# Patient Record
Sex: Female | Born: 1940
Health system: Southern US, Community
[De-identification: ages and names within clinical notes are randomized; demographics above are authoritative.]

## PROBLEM LIST (undated history)

## (undated) DIAGNOSIS — E782 Mixed hyperlipidemia: Secondary | ICD-10-CM

## (undated) DIAGNOSIS — I10 Essential (primary) hypertension: Secondary | ICD-10-CM

## (undated) DIAGNOSIS — J309 Allergic rhinitis, unspecified: Secondary | ICD-10-CM

## (undated) DIAGNOSIS — I471 Supraventricular tachycardia, unspecified: Secondary | ICD-10-CM

## (undated) DIAGNOSIS — G4700 Insomnia, unspecified: Secondary | ICD-10-CM

## (undated) DIAGNOSIS — Z86010 Personal history of colonic polyps: Secondary | ICD-10-CM

## (undated) DIAGNOSIS — M797 Fibromyalgia: Secondary | ICD-10-CM

## (undated) DIAGNOSIS — K589 Irritable bowel syndrome without diarrhea: Secondary | ICD-10-CM

## (undated) DIAGNOSIS — Z8542 Personal history of malignant neoplasm of other parts of uterus: Secondary | ICD-10-CM

## (undated) DIAGNOSIS — F411 Generalized anxiety disorder: Secondary | ICD-10-CM

## (undated) DIAGNOSIS — E7800 Pure hypercholesterolemia, unspecified: Secondary | ICD-10-CM

## (undated) DIAGNOSIS — R002 Palpitations: Secondary | ICD-10-CM

## (undated) DIAGNOSIS — F419 Anxiety disorder, unspecified: Secondary | ICD-10-CM

## (undated) DIAGNOSIS — N3010 Interstitial cystitis (chronic) without hematuria: Secondary | ICD-10-CM

## (undated) HISTORY — DX: Supraventricular tachycardia: I47.1

## (undated) HISTORY — DX: Anxiety disorder, unspecified: F41.9

## (undated) HISTORY — PX: BACK SURGERY: SHX140

## (undated) HISTORY — PX: BLADDER SURGERY: SHX569

## (undated) HISTORY — PX: TONSILLECTOMY: SUR1361

## (undated) HISTORY — PX: CATARACT EXTRACTION: SUR2

## (undated) HISTORY — DX: Insomnia, unspecified: G47.00

## (undated) HISTORY — DX: Mixed hyperlipidemia: E78.2

## (undated) HISTORY — DX: Essential (primary) hypertension: I10

## (undated) HISTORY — DX: Generalized anxiety disorder: F41.1

## (undated) HISTORY — DX: Palpitations: R00.2

## (undated) HISTORY — DX: Irritable bowel syndrome without diarrhea: K58.9

## (undated) HISTORY — DX: Personal history of malignant neoplasm of other parts of uterus: Z85.42

## (undated) HISTORY — PX: ABDOMINAL HYSTERECTOMY: SUR658

## (undated) HISTORY — DX: Fibromyalgia: M79.7

## (undated) HISTORY — DX: Interstitial cystitis (chronic) without hematuria: N30.10

## (undated) HISTORY — DX: Personal history of colonic polyps: Z86.010

## (undated) HISTORY — DX: Allergic rhinitis, unspecified: J30.9

## (undated) HISTORY — DX: Pure hypercholesterolemia, unspecified: E78.00

## (undated) HISTORY — DX: Supraventricular tachycardia, unspecified: I47.10

---

## 1998-11-19 ENCOUNTER — Emergency Department (HOSPITAL_COMMUNITY): Admission: EM | Admit: 1998-11-19 | Discharge: 1998-11-19 | Payer: Self-pay | Admitting: Emergency Medicine

## 1998-11-19 ENCOUNTER — Encounter: Payer: Self-pay | Admitting: Emergency Medicine

## 1998-11-23 ENCOUNTER — Other Ambulatory Visit: Admission: RE | Admit: 1998-11-23 | Discharge: 1998-11-23 | Payer: Self-pay | Admitting: *Deleted

## 1998-12-21 ENCOUNTER — Emergency Department (HOSPITAL_COMMUNITY): Admission: EM | Admit: 1998-12-21 | Discharge: 1998-12-21 | Payer: Self-pay | Admitting: Emergency Medicine

## 1998-12-25 ENCOUNTER — Ambulatory Visit (HOSPITAL_COMMUNITY): Admission: RE | Admit: 1998-12-25 | Discharge: 1998-12-25 | Payer: Self-pay | Admitting: Urology

## 1999-06-03 ENCOUNTER — Encounter: Payer: Self-pay | Admitting: Emergency Medicine

## 1999-06-03 ENCOUNTER — Emergency Department (HOSPITAL_COMMUNITY): Admission: EM | Admit: 1999-06-03 | Discharge: 1999-06-03 | Payer: Self-pay | Admitting: Emergency Medicine

## 1999-08-26 ENCOUNTER — Encounter: Admission: RE | Admit: 1999-08-26 | Discharge: 1999-08-26 | Payer: Self-pay | Admitting: Neurological Surgery

## 1999-08-28 ENCOUNTER — Encounter: Payer: Self-pay | Admitting: Neurological Surgery

## 1999-08-28 ENCOUNTER — Encounter: Admission: RE | Admit: 1999-08-28 | Discharge: 1999-08-28 | Payer: Self-pay | Admitting: Neurological Surgery

## 1999-11-25 ENCOUNTER — Other Ambulatory Visit: Admission: RE | Admit: 1999-11-25 | Discharge: 1999-11-25 | Payer: Self-pay | Admitting: *Deleted

## 2000-02-18 ENCOUNTER — Emergency Department (HOSPITAL_COMMUNITY): Admission: EM | Admit: 2000-02-18 | Discharge: 2000-02-18 | Payer: Self-pay | Admitting: Emergency Medicine

## 2000-05-25 ENCOUNTER — Other Ambulatory Visit: Admission: RE | Admit: 2000-05-25 | Discharge: 2000-05-25 | Payer: Self-pay | Admitting: *Deleted

## 2000-05-25 ENCOUNTER — Encounter (INDEPENDENT_AMBULATORY_CARE_PROVIDER_SITE_OTHER): Payer: Self-pay

## 2000-06-16 ENCOUNTER — Ambulatory Visit (HOSPITAL_COMMUNITY): Admission: RE | Admit: 2000-06-16 | Discharge: 2000-06-16 | Payer: Self-pay | Admitting: *Deleted

## 2000-06-16 ENCOUNTER — Encounter: Payer: Self-pay | Admitting: *Deleted

## 2000-06-17 ENCOUNTER — Inpatient Hospital Stay (HOSPITAL_COMMUNITY): Admission: RE | Admit: 2000-06-17 | Discharge: 2000-06-19 | Payer: Self-pay | Admitting: *Deleted

## 2000-06-17 ENCOUNTER — Encounter (INDEPENDENT_AMBULATORY_CARE_PROVIDER_SITE_OTHER): Payer: Self-pay

## 2000-06-17 ENCOUNTER — Encounter (INDEPENDENT_AMBULATORY_CARE_PROVIDER_SITE_OTHER): Payer: Self-pay | Admitting: Specialist

## 2000-10-05 ENCOUNTER — Encounter: Payer: Self-pay | Admitting: Neurological Surgery

## 2000-10-05 ENCOUNTER — Encounter: Admission: RE | Admit: 2000-10-05 | Discharge: 2000-10-05 | Payer: Self-pay | Admitting: Neurological Surgery

## 2000-10-08 ENCOUNTER — Encounter: Payer: Self-pay | Admitting: Neurological Surgery

## 2000-10-08 ENCOUNTER — Encounter: Admission: RE | Admit: 2000-10-08 | Discharge: 2000-10-08 | Payer: Self-pay | Admitting: Neurological Surgery

## 2000-11-20 ENCOUNTER — Encounter: Payer: Self-pay | Admitting: Family Medicine

## 2000-11-20 ENCOUNTER — Encounter: Admission: RE | Admit: 2000-11-20 | Discharge: 2000-11-20 | Payer: Self-pay | Admitting: Family Medicine

## 2001-01-11 ENCOUNTER — Encounter (INDEPENDENT_AMBULATORY_CARE_PROVIDER_SITE_OTHER): Payer: Self-pay | Admitting: *Deleted

## 2001-01-11 ENCOUNTER — Encounter: Payer: Self-pay | Admitting: Internal Medicine

## 2001-01-11 ENCOUNTER — Ambulatory Visit (HOSPITAL_COMMUNITY): Admission: RE | Admit: 2001-01-11 | Discharge: 2001-01-11 | Payer: Self-pay | Admitting: Gastroenterology

## 2001-04-28 ENCOUNTER — Emergency Department (HOSPITAL_COMMUNITY): Admission: EM | Admit: 2001-04-28 | Discharge: 2001-04-28 | Payer: Self-pay | Admitting: Emergency Medicine

## 2001-04-28 ENCOUNTER — Encounter: Payer: Self-pay | Admitting: Family Medicine

## 2001-11-03 ENCOUNTER — Other Ambulatory Visit: Admission: RE | Admit: 2001-11-03 | Discharge: 2001-11-03 | Payer: Self-pay | Admitting: Obstetrics and Gynecology

## 2001-12-02 ENCOUNTER — Emergency Department (HOSPITAL_COMMUNITY): Admission: EM | Admit: 2001-12-02 | Discharge: 2001-12-02 | Payer: Self-pay | Admitting: Emergency Medicine

## 2002-11-22 ENCOUNTER — Encounter: Admission: RE | Admit: 2002-11-22 | Discharge: 2003-02-20 | Payer: Self-pay | Admitting: Rheumatology

## 2003-04-07 ENCOUNTER — Ambulatory Visit (HOSPITAL_COMMUNITY): Admission: RE | Admit: 2003-04-07 | Discharge: 2003-04-07 | Payer: Self-pay | Admitting: Urology

## 2003-04-07 ENCOUNTER — Ambulatory Visit (HOSPITAL_BASED_OUTPATIENT_CLINIC_OR_DEPARTMENT_OTHER): Admission: RE | Admit: 2003-04-07 | Discharge: 2003-04-07 | Payer: Self-pay | Admitting: Urology

## 2003-09-12 ENCOUNTER — Ambulatory Visit (HOSPITAL_BASED_OUTPATIENT_CLINIC_OR_DEPARTMENT_OTHER): Admission: RE | Admit: 2003-09-12 | Discharge: 2003-09-12 | Payer: Self-pay | Admitting: Urology

## 2003-11-09 ENCOUNTER — Inpatient Hospital Stay (HOSPITAL_COMMUNITY): Admission: RE | Admit: 2003-11-09 | Discharge: 2003-11-17 | Payer: Self-pay | Admitting: Urology

## 2003-11-10 ENCOUNTER — Encounter (INDEPENDENT_AMBULATORY_CARE_PROVIDER_SITE_OTHER): Payer: Self-pay | Admitting: *Deleted

## 2003-12-18 ENCOUNTER — Ambulatory Visit (HOSPITAL_COMMUNITY): Admission: RE | Admit: 2003-12-18 | Discharge: 2003-12-18 | Payer: Self-pay | Admitting: Urology

## 2004-01-25 ENCOUNTER — Ambulatory Visit: Payer: Self-pay | Admitting: Internal Medicine

## 2005-01-27 HISTORY — PX: ESOPHAGOGASTRODUODENOSCOPY: SHX1529

## 2005-05-20 ENCOUNTER — Ambulatory Visit: Payer: Self-pay | Admitting: Internal Medicine

## 2005-05-21 ENCOUNTER — Ambulatory Visit: Payer: Self-pay | Admitting: Internal Medicine

## 2005-05-21 ENCOUNTER — Encounter (INDEPENDENT_AMBULATORY_CARE_PROVIDER_SITE_OTHER): Payer: Self-pay | Admitting: *Deleted

## 2005-07-01 ENCOUNTER — Ambulatory Visit: Payer: Self-pay | Admitting: Internal Medicine

## 2005-07-02 ENCOUNTER — Ambulatory Visit: Payer: Self-pay | Admitting: Internal Medicine

## 2005-10-17 ENCOUNTER — Inpatient Hospital Stay (HOSPITAL_COMMUNITY): Admission: EM | Admit: 2005-10-17 | Discharge: 2005-10-19 | Payer: Self-pay | Admitting: Emergency Medicine

## 2005-10-22 ENCOUNTER — Ambulatory Visit: Payer: Self-pay | Admitting: Gastroenterology

## 2005-11-05 ENCOUNTER — Ambulatory Visit: Payer: Self-pay | Admitting: Internal Medicine

## 2005-12-10 ENCOUNTER — Ambulatory Visit (HOSPITAL_COMMUNITY): Admission: RE | Admit: 2005-12-10 | Discharge: 2005-12-10 | Payer: Self-pay | Admitting: Urology

## 2007-08-09 ENCOUNTER — Telehealth: Payer: Self-pay | Admitting: Internal Medicine

## 2007-09-17 DIAGNOSIS — Z860100 Personal history of colon polyps, unspecified: Secondary | ICD-10-CM | POA: Insufficient documentation

## 2007-09-17 DIAGNOSIS — K589 Irritable bowel syndrome without diarrhea: Secondary | ICD-10-CM

## 2007-09-17 DIAGNOSIS — IMO0001 Reserved for inherently not codable concepts without codable children: Secondary | ICD-10-CM

## 2007-09-17 DIAGNOSIS — M797 Fibromyalgia: Secondary | ICD-10-CM | POA: Insufficient documentation

## 2007-09-17 DIAGNOSIS — N3010 Interstitial cystitis (chronic) without hematuria: Secondary | ICD-10-CM | POA: Insufficient documentation

## 2007-09-17 DIAGNOSIS — Z8542 Personal history of malignant neoplasm of other parts of uterus: Secondary | ICD-10-CM | POA: Insufficient documentation

## 2007-09-17 DIAGNOSIS — F411 Generalized anxiety disorder: Secondary | ICD-10-CM

## 2007-09-17 HISTORY — DX: Interstitial cystitis (chronic) without hematuria: N30.10

## 2007-09-17 HISTORY — DX: Irritable bowel syndrome, unspecified: K58.9

## 2007-09-17 HISTORY — DX: Personal history of colonic polyps: Z86.010

## 2007-09-17 HISTORY — DX: Personal history of malignant neoplasm of other parts of uterus: Z85.42

## 2007-09-17 HISTORY — DX: Personal history of colon polyps, unspecified: Z86.0100

## 2007-09-20 ENCOUNTER — Ambulatory Visit: Payer: Self-pay | Admitting: Internal Medicine

## 2007-09-22 ENCOUNTER — Telehealth: Payer: Self-pay | Admitting: Internal Medicine

## 2007-11-08 ENCOUNTER — Telehealth: Payer: Self-pay | Admitting: Internal Medicine

## 2007-11-09 ENCOUNTER — Encounter: Payer: Self-pay | Admitting: Internal Medicine

## 2009-07-25 ENCOUNTER — Emergency Department (HOSPITAL_COMMUNITY): Admission: EM | Admit: 2009-07-25 | Discharge: 2009-07-25 | Payer: Self-pay | Admitting: Emergency Medicine

## 2009-12-27 ENCOUNTER — Encounter: Admission: RE | Admit: 2009-12-27 | Discharge: 2009-12-27 | Payer: Self-pay

## 2010-01-27 HISTORY — PX: COLONOSCOPY: SHX174

## 2010-03-28 ENCOUNTER — Telehealth: Payer: Self-pay | Admitting: Internal Medicine

## 2010-04-04 NOTE — Progress Notes (Signed)
Summary: Med refill  Phone Note Call from Patient Call back at Home Phone [phone]   Caller: Patient Call For: Dr. Juanda Chance Reason for Call: Refill Medication Summary of Call: Needs a refill for her Pantoprazole.Marland KitchenMarland KitchenMarland KitchenWalmart [facility] [phone].Marland KitchenMarland KitchenAppt sch'd on 05-01-10 Initial call taken by: Karna Christmas,  March 28, 2010 12:39 PM    New/Updated Medications: PANTOPRAZOLE SODIUM 40 MG TBEC (PANTOPRAZOLE SODIUM) Take 1 tablet by mouth once a day MUST KEEP OFFICE VISIT FOR FURTHER REFILLS! Prescriptions: PANTOPRAZOLE SODIUM 40 MG TBEC (PANTOPRAZOLE SODIUM) Take 1 tablet by mouth once a day MUST KEEP OFFICE VISIT FOR FURTHER REFILLS!  #30 x 1   Entered by:   Lamona Curl CMA (AAMA)   Authorized by:   Hart Carwin MD   Signed by:   Lamona Curl CMA (AAMA) on 03/28/2010   Method used:   Electronically to        [facility]  [phone]* (retail)       [address]       [place], [place]  [address]       Ph: [phone] or [phone]       Fax: [phone]   RxID:   [id number] PANTOPRAZOLE SODIUM 40 MG TBEC (PANTOPRAZOLE SODIUM) Take 1 tablet by mouth once a day MUST KEEP OFFICE VISIT FOR FURTHER REFILLS!  #30 x 1   Entered by:   Lamona Curl CMA (AAMA)   Authorized by:   Hart Carwin MD   Signed by:   Lamona Curl CMA (AAMA) on 03/28/2010   Method used:   Electronically to        [facility] [phone]* (retail)       [address]       [place], [place]  [address]       Ph: [phone] or [phone]       Fax: [phone]   RxID:   [id number]  Prescription [facility] [address] in error. D/C'ed Prescription with Tresa Endo @ [facility] [address] Dottie Nelson-Smith CMA (AAMA)  March 28, 2010 1:15 PM

## 2010-05-01 ENCOUNTER — Ambulatory Visit (INDEPENDENT_AMBULATORY_CARE_PROVIDER_SITE_OTHER): Payer: Medicare Other | Admitting: Internal Medicine

## 2010-05-01 ENCOUNTER — Encounter: Payer: Self-pay | Admitting: Internal Medicine

## 2010-05-01 VITALS — BP 138/66 | HR 68 | Ht 65.5 in | Wt 143.6 lb

## 2010-05-01 DIAGNOSIS — Z860100 Personal history of colon polyps, unspecified: Secondary | ICD-10-CM

## 2010-05-01 DIAGNOSIS — R1012 Left upper quadrant pain: Secondary | ICD-10-CM

## 2010-05-01 DIAGNOSIS — K589 Irritable bowel syndrome without diarrhea: Secondary | ICD-10-CM

## 2010-05-01 MED ORDER — PEG-KCL-NACL-NASULF-NA ASC-C 100 G PO SOLR: 1.0000 | Freq: Once | ORAL | Status: AC

## 2010-05-01 MED ORDER — PANTOPRAZOLE SODIUM 40 MG PO TBEC: DELAYED_RELEASE_TABLET | ORAL | Status: DC

## 2010-05-01 MED ORDER — HYOSCYAMINE SULFATE 0.125 MG PO TABS: ORAL_TABLET | ORAL | Status: DC

## 2010-05-01 NOTE — Patient Instructions (Signed)
You have been scheduled for a colonoscopy with propofol. Please see separate instructions given to you at your visit today. Please pick up Moviprep at your pharmacy. We have sent a prescription for Protonix 40 mg for you to take 1 tablet daily. We have sent a prescription for Levsin 0.125 mg tablets. You should take 1 tablet every 4-6 hours as needed for abdominal pain and spasms.

## 2010-05-01 NOTE — Progress Notes (Signed)
[name] 1940/12/28 MRN [id number]     History of Present Illness:  This is a 70 year old white female with severe interstitial cystitis who is followed by Dr [name]. She also has irritable bowel syndrome and dyspepsia controlled with Protonix 40 mg daily. She has marked sensitivity and intolerance to many medications, especially antibiotics. She has had a flareup of her IBS after being on several antibiotics for sinusitis, urinary tract infection and for upper respiratory infection. Her last upper endoscopy in April 2007 for nausea and vomiting was unremarkable. Her last colonoscopy in 2002 showed a hyperplastic polyp. Her sister has multiple polyps and had a segmental resection of her colon for polyposis. Patient is due for a recall colonoscopy. She is status post remote  hysterectomy for uterine cancer.   Past Medical History  Diagnosis Date  . Anxiety   . Fibromyalgia   . History of colon polyps     hyperplastic  . History of uterine cancer   . Irritable bowel syndrome   . Chronic interstitial cystitis    Past Surgical History  Procedure Date  . Abdominal hysterectomy   . Bladder surgery   . Tonsillectomy   . Back surgery     L5    reports that she has never smoked. She has never used smokeless tobacco. She reports that she drinks alcohol. She reports that she does not use illicit drugs. family history includes Breast cancer in her maternal aunt and sister; Colon polyps in her sister; and Lung cancer in her father.  There is no history of Colon cancer. No Known Allergies      Review of Systems: Positive for multiple drug intolerances, nausea and left upper quadrant abdominal discomfort. Variable bowel habits with predominant diarrhea. Denies rectal bleeding.  The remainder of the 10 point ROS is negative except as outlined in H&P   Physical Exam: General appearance  Well developed, in no distress. Eyes- non icteric. HEENT nontraumatic, normocephalic. Mouth no  lesions, tongue papillated, no cheilosis. Neck supple without adenopathy, thyroid not enlarged, no carotid bruits, no JVD. Lungs Clear to auscultation bilaterally. Cor normal S1 normal S2, regular rhythm , no murmur,  quiet precordium. Abdomen soft abdomen but diffuse tenderness in all quadrants, mostly in the left upper and right upper quadrant. There was no fullness. Bowel sounds were normal active. There is no distention. Rectal: Normal perianal area. Soft Hemoccult negative stool. Extremities no pedal edema. Skin no lesions. Neurological alert and oriented x 3. Psychological normal mood and affect.  Assessment and Plan:  Problems #1 irritable bowel syndrome with predominant diarrhea. She has had eacerbation triggered by multiple broad spectrum antibiotics antibiotics. She is to continue a high fiber diet and Levsin sublingual 0.125 mg. She tried it  in the past and was able to tolerate it. We will refill Protonix 40 mg daily as well.   Problem #2 : History of colon polyps. She is due for a recall colonoscopy. This could be scheduled with Moviprep. We have discussed prep as well as propofol sedation. She would do better using propofol anesthesia because of her hypersensitivity and I suspect high requirement for sedatives.   05/01/2010 [name]

## 2010-05-14 ENCOUNTER — Encounter: Payer: Self-pay | Admitting: Internal Medicine

## 2010-05-15 ENCOUNTER — Encounter: Payer: Self-pay | Admitting: Internal Medicine

## 2010-05-15 ENCOUNTER — Ambulatory Visit (AMBULATORY_SURGERY_CENTER): Payer: Medicare Other | Admitting: Internal Medicine

## 2010-05-15 DIAGNOSIS — R197 Diarrhea, unspecified: Secondary | ICD-10-CM

## 2010-05-15 DIAGNOSIS — Z860100 Personal history of colon polyps, unspecified: Secondary | ICD-10-CM

## 2010-05-15 DIAGNOSIS — Z1211 Encounter for screening for malignant neoplasm of colon: Secondary | ICD-10-CM

## 2010-05-15 MED ORDER — SODIUM CHLORIDE 0.9 % IV SOLN: 500.0000 mL | INTRAVENOUS | Status: DC

## 2010-05-15 NOTE — Patient Instructions (Signed)
Findings: Normal  Recommendations:  High Fiber Diet                                   Repeat exam in 10 Years

## 2010-05-16 ENCOUNTER — Telehealth: Payer: Self-pay

## 2010-05-16 NOTE — Telephone Encounter (Deleted)
Follow up Call- Patient questions:  Do you have a fever, pain , or abdominal swelling? no Pain Score  0 *  Have you tolerated food without any problems? yes  Have you been able to return to your normal activities? yes  Do you have any questions about your discharge instructions: Diet   no Medications  no Follow up visit  no  Do you have questions or concerns about your Care? no   Follow up Call- Patient questions:  Do you have a fever, pain , or abdominal swelling? no Pain Score  0 *  Have you tolerated food without any problems? yes  Have you been able to return to your normal activities? yes  Do you have any questions about your discharge instructions: Diet   no Medications  no Follow up visit  no  Do you have questions or concerns about your Care? no

## 2010-05-16 NOTE — Telephone Encounter (Signed)

## 2010-06-14 NOTE — Procedures (Signed)
[facility]  Patient:    [name], [name] Visit Number: [id number] MRN: [id number]          Service Type: Attending:  Verlin Grills, M.D. Dictated by:   Verlin Grills, M.D. Proc. Date: 01/11/01   CC:         Darcella Cheshire, M.D.  Jamison Neighbor, M.D.  Abran Cantor. Clovis Riley, MD   Procedure Report  PROCEDURE:  Esophagogastroduodenoscopy, antral biopsy, and colonoscopy with rectal polypectomy.  REFERRING PHYSICIANS: 1. Dr. Lucienne Minks. 2. Dr. Marcelyn Bruins. 3. Dr. Lupe Carney  PROCEDURE INDICATION:  [name] (date of birth October 05, 1940) is a 70 year old female.  [name] has had a "nervous stomach" since her teenage years manifested by early morning nausea.  She has a history of chronic irritable bowel syndrome manifested by nonbloody diarrhea alternating with constipation.  She also is treated for chronic interstitial cystitis and TMJ syndrome.  Approximately a year ago, [name] was evaluated in the [facility] Emergency Room with nausea and upper abdominal discomfort.  She was placed on Protonix, and her symptoms resolved.  She never required endoscopic or radiologic evaluation of her intestinal tract.  [name] entered an interstitial cystitis study.  She was unable to use any of her usual medications for interstitial cystitis during the study.  She thinks her interstitial cystitis flared up during the study and ever since completing the study, she has had early morning and late evening bouts of predominantly left upper quadrant abdominal pain and a constant sensation of nausea unassociated with vomiting or weight loss.  She describes constant, intense anxiety.  Her bowel movement remain unchanged.  Her bouts of pain and nausea are unrelated to being in the fed or fasting state.  November 19, 2000, comprehensive metabolic profile and CBC were normal. November 23, 2000, CT scan of the abdomen and  pelvis revealed a stable left adrenal adenoma.  MEDICATION ALLERGIES:  CODEINE which causes nausea.  CHRONIC MEDICATIONS:  Protonix, Promethazine, hyoscyamine, estrogen replacement therapy, Provera, Xanax, multivitamin, hydrocodone, Pepto-Bismol, Imodium, betaine.  PAST MEDICAL HISTORY: 1. Chronic interstitial cystitis. 2. Chronic irritable bowel syndrome. 3. Chronic TMJ syndrome. 4. Remote bladder surgery. 5. Hysterectomy for cervical cancer. 6. Back surgery.  FAMILY HISTORY:  The patients brother and sister have undergone colonoscopic exams for polyp removal.  There is no family history of colon cancer.  I discussed with [name] the complications associated with colonoscopy, polypectomy, and esophagogastroduodenoscopy, including intestinal bleeding and intestinal perforation.  [name] has signed the operative permit.  ENDOSCOPIST:  Verlin Grills, M.D.  PREMEDICATION:  Versed 10 mg, Demerol 100 mg.  ENDOSCOPE:  Olympus gastroscope and pediatric colonoscope.  PROCEDURE:  Esophagogastroduodenoscopy.  DESCRIPTION OF PROCEDURE:  After obtaining informed consent, [name] was placed in the left lateral decubitus position.  I administered intravenous Demerol and intravenous Versed to achieve conscious sedation for the procedure.  The patients blood pressure, oxygen saturation, and cardiac rhythm were monitored throughout the procedure and documented in the medical record.  The Olympus gastroscope was passed through the posterior hypopharynx into the proximal esophagus without difficulty.  The hypopharynx, larynx, and vocal cords appeared normal.  Esophagoscopy:  The proximal, mid, and lower segments of the esophagus appeared normal.  The squamocolumnar junction and the esophagogastric junction are noted at 42 cm from the incisor teeth.  Endoscopically, there is no evidence for the presence of erosive esophagitis, Barretts esophagus, esophageal mucosal  scarring,  or esophageal ulceration.  Gastroscopy:  Retroflexed view of the gastric cardia and fundus was normal. The gastric body, antrum, and pylorus appeared normal.  Duodenoscopy:  Upon entering the duodenal bulb, there were scattered mucosal erosions and duodenal bulb mucosal scarring.  The mid-descending duodenum appeared normal.  Biopsy:  A biopsy was taken from the distal gastric antrum for CLOtest to rule out Helicobacter pylori antral gastritis.  ASSESSMENT:  Mucosal erosions and mucosal scarring of the duodenal bulb; otherwise normal esophagogastroduodenoscopy.  CLOtest to rule out Helicobacter pylori antral gastritis pending.  PROCEDURE:  Proctocolonoscopy with rectal polypectomy.  DESCRIPTION OF PROCEDURE:  Anal inspection was normal.  Digital rectal exam was normal.  The Olympus pediatric video colonoscope was introduced into the rectum and easily advanced to the cecum.  A normal-appearing ileocecal valve was intubated and the distal ileum inspected.  Colonic preparation for the exam today was excellent.  RECTUM AND SIGMOID COLON:  At 20 cm from the anal verge, two 0.5 mm sessile polyps were removed with the cold biopsy forceps and submitted for pathological interpretation.  DESCENDING COLON:  Normal.  SPLENIC FLEXURE:  Normal.  TRANSVERSE COLON:  Normal.  HEPATIC FLEXURE:  Normal.  ASCENDING COLON:  Normal.  CECUM AND ILEOCECAL VALVE:  Normal.  DISTAL ILEUM:  Normal.  ASSESSMENT: 1. From the proximal rectum, at 20 cm from the anal verge, two 0.5 mm sessile polyps were removed with the cold biopsy forceps and submitted for pathologic interpretation.  Otherwise,  normal proctocolonoscopy to the cecum with distal ileal inspection.   PLAN:  I will meet with [name] in my office to go over her CLOtest test and if it is positive, I will treat her for Helicobacter pylori antral gastritis. Dictated by:   Verlin Grills, M.D. Attending:  Verlin Grills, M.D. DD:  01/11/01 TD:  01/12/01 Job: [id number]

## 2010-06-14 NOTE — H&P (Signed)
[name], [name]                 ACCOUNT NO.:  [id number]   MEDICAL RECORD NO.:  [id number]          PATIENT TYPE:  EMS   LOCATION:  ED                           FACILITY:  [facility]   PHYSICIAN:  Jackie Plum, M.D.DATE OF BIRTH:  04-Aug-1940   DATE OF ADMISSION:  10/17/2005  DATE OF DISCHARGE:                                HISTORY & PHYSICAL   CHIEF COMPLAINT:  Abdominal pain.   HISTORY OF PRESENT ILLNESS:  The patient is a 70 year old lady who presented  with a 2-week history of diarrhea and nausea, which has been progressively  worsened, culminating in abdominal pain over the last 24 hours.  She had an  episode of vomiting earlier today.  She denies any history of fever or  chills.  She denies any recent travels or any well water exposure.  She had  been on antibiotics which were started recently by Dr. Logan Bores.  She was  unable to give a specific name of the antibiotic.  In the emergency room the  patient was seen by Dr. Oletta Lamas of emergency medicine.  X-ray of the abdomen as  well as an x-ray of the chest was done.  It indicated no cardiopulmonary  disease acutely.  There were scattered right abdominal air-fluid levels with  minimal small bowel distention in the midabdominal area.  There was  nonspecific gas pattern of the bowel without any evidence of obstruction.  The hospitalist was asked to evaluate for admission.   PAST MEDICAL HISTORY:  1. Positive for history of bladder reconstruction by Dr. Logan Bores in October      2005.  2. She has a history of chronic interstitial cystitis.  3. History of fibromyalgia.  4. Irritable bowel syndrome.  5. History of chronic anxiety.  6. Past history of cervical malignancy.  7. She also has a history of vulvodynia.   The patient was seen by Dr. Juanda Chance in April of this year for nausea,  vomiting and diarrhea, at which time EGD showed muscle spasm according to  the patient.  She was started on a pill with relief of her symptoms until  recurrence in the last 2 weeks or so.   MEDICATION HISTORY:  The patient is on clonazepam, Ultram, and an unknown  antibiotic pill.  She does not have any known medication allergies.   She lives with her husband.  Does not smoke cigarettes or drink alcohol.   FAMILY HISTORY:  Negative for any bowel cancers or bowel disease.   REVIEW OF SYSTEMS:  The patient denies any chest pain, no shortness of  breath.  No cough or sputum production.  No dysuria or frequency of  micturition.  No heat or cold intolerance.   PHYSICAL EXAMINATION:  VITAL SIGNS:  BP 123/53, pulse 64, respirations 20,  temperature 97.3 degrees Fahrenheit.  O2 saturation 100% on room air.  GENERAL EXAM:  The patient was not acutely ill-looking.  She was not in any  acute cardiopulmonary distress.  HEENT:  Normocephalic, atraumatic.  Pupils were equal, round and reactive to  light.  Extraocular movements intact.  Oropharynx moist.  NECK:  Supple, no JVD.  LUNGS:  Clear to auscultation.  CARDIAC:  Regular rate and rhythm, no gallops or murmur.  ABDOMEN:  Soft with mild generalized tenderness without any rebound on deep  palpation.  Bowel sounds were present.  They were normoactive.  EXTREMITIES:  No cyanosis, no edema.   LAB WORK:  X-ray as noted above.  CT scan of the abdomen and pelvis was  requested.  The results were pending at the time of this dictation.  CBC was  essentially unremarkable, within normal limits.  Chemistry:  Sodium 140,  potassium 4.2, chloride 101, CO2 21, glucose 111, BUN 14, creatinine 0.87.  The rest of her liver function testing was within normal limits.  Lipase was  normal at 26.  UA was positive for moderate blood with negative protein,  trace leukocytes 7-10 wbc's per high-power field and few bacteria.   IMPRESSION:  1. Abdominal pain, nausea, and episode of vomiting.  2. Pyuria.   Of course, the patient's symptoms may be related to her irritable bowel  syndrome but we cannot rule out any  occult gastrointestinal pathology.  Will  follow up on a CT scan.  We will need to rule out any evidence of focal  ileus.  We will start her on antibiotics for presumptive urinary infection.  We will give her Levsin p.r.n.  I will ask Dr. Regino Schultze partners at [facility] to take a look at the patient while she is in the hospital.  She will be  admitted for 23-hour observation only and if all is well, she will be able  to go home tomorrow.  We are going to hydrate her, and further  recommendations will be made as the data base expands.      Jackie Plum, M.D.  Electronically Signed     GO/MEDQ  D:  10/17/2005  T:  10/17/2005  Job:  [id number]   cc:   Jamison Neighbor, M.D.  Fax: [phone]   Hedwig Morton. Juanda Chance, MD  [address]  [place]  [place] [address]

## 2010-06-14 NOTE — Discharge Summary (Signed)
NAMEJONI, COLEGROVE                 ACCOUNT NO.:  [id number]   MEDICAL RECORD NO.:  [id number]          PATIENT TYPE:  INP   LOCATION:  0363                         FACILITY:  [facility]   PHYSICIAN:  Jamison Neighbor, M.D.  DATE OF BIRTH:  [birth year]   DATE OF ADMISSION:  11/09/2003  DATE OF DISCHARGE:  11/17/2003                                 DISCHARGE SUMMARY   DISCHARGE DIAGNOSES:  1.  Chronic interstitial cystitis.  2.  Irritable bowel syndrome.  3.  Fibromyalgia.  4.  Chronic anxiety.  5.  Past history of cervical malignancy.  6.  Temporomandibular syndrome.  7.  Vulvodynia.   PROCEDURES:  Cystectomy and ileoneobladder performed on November 10, 2003.   HISTORY:  This 70 year old female has severe interstitial cystitis.  The  patient has had a decrease in bladder capacity, less than 300 mL under  anesthesia.  Despite the use of medications, bladder installations, etc. She  has not responded well to therapy and remains narcotic dependent.  The  patient voids every 1/2 hour due to the small size of her bladder.  The  patient  is now to undergo cystectomy.  Her situation is complicated by the  fact that she is  J[employer]'s [employer] but she was willing to use the Cell  Saver for the procedure.  She was admitted to the hospital 1 day prior to  admission to undergo bowel preparation.   PAST MEDICAL HISTORY:  Remarkable for interstitial cystitis, TMJ syndrome,  fibromyalgia and irritable bowel syndrome.  She also has had problems with  headaches, although not true migraine headaches.  Previous surgeries was  hysterectomy for cervical cancer in 2002, back surgery in 1994.  She also  had a tonsillectomy as a child.   MEDICATIONS:  At the time of admission, the patient's medications were  Klonopin, Protonix and pain medications.  She does not use tobacco or  alcohol.   PHYSICAL EXAMINATION:  The patient's physical examination was pertinent for  her abdominal pain and pelvic pain and was  otherwise unremarkable.   HOSPITAL COURSE:  She was admitted to the hospital and completed her bowel  prepubertal.  She was taken to the operating room on November 10, 2003, where  she underwent cystectomy and ileoneobladder using the Stuter technique.  The  patient had very minimal blood loss of less than 250 mL.  The patient did  agree to use the cell saver, however the total volume of treatment was less  than 150 mL and was not felt to be appropriate for retransfusion.  The  patient's postoperative course was quite unremarkable.  The [employer]  drain slowed down very nicely within the first day or so.  She had excellent  urinary output and her postoperative laboratory studies were fine.  She had  her NG tube removed by the second postoperative day.  The patient had one  episode of nausea on November 13, 2003 and then did feel somewhat better.  She began to have bowel sounds at that point but had not yet had a bowel  movement.  The patient  developed normal bowel sounds and did begin to have  both flatus and bowel movements.  Her biggest concern was anxiety and she  had some nausea associated with the anxiety.  Once the patient had been  treated for this, she did settle down.  She was felt to be ready for  discharge by November 17, 2003.  The patient had urinary output.  Her Al Pimple drains had decreased.  The creatine average then was just 0.6 and this  was removed.  The patient was eating pretty well and did not have any  nausea.  Her incisions looked fine and there were no complications or  problems associated with the procedure.  She was sent home with a Foley  catheter in place.  This was attached to 2 [employer] drains and plans  for her to have a cystogram in order to make sure that she has no leaks, as  well as an IVP to look for function and to check that the integrity of the  anastomosis to the Stuter loop.  She will then have the Foley catheter  removed and the drains  at the same time.  She will instructed in self  catheterization at that point and will be required to do that every few  hours until we can ascertain that she is able to urinate normally on her  own.      RJE/MEDQ  D:  12/04/2003  T:  12/04/2003  Job:  [id number]   cc:   L. Lupe Carney, M.D.  [address] [place]  [place] [address]  Fax: [phone]

## 2010-06-14 NOTE — Discharge Summary (Signed)
[facility]  Patient:    [name], [name]                  MRN: [id number] Adm. Date:  [id number] Disc. Date: [id number] Attending:  Lanna Poche                           Discharge Summary  HISTORY OF PRESENT ILLNESS:  The patient is a 70 year old, white female admitted with the diagnosis of adenocarcinoma of the endometrium for surgery.  HOSPITAL COURSE:  She was taken to the operating room on Jun 17, 2000, where under general anesthesia total abdominal hysterectomy with bilateral salpingo-oophorectomy was done.  The patients operative and postoperative course has been uneventful.  She is being discharged on postop day #2 with instructions to return home post recovery.  SPECIAL INSTRUCTIONS:  No vaginal entrance x 6 weeks.  FOLLOWUP:  She will see Dr. Roberto Scales on Tuesday to have her staples removed and further instructions will be given at that time.  LABORATORY DATA AND X-RAY FINDINGS:  Pathology report showed well-differentiated endometrial adenocarcinoma, FIGO grade 1.  Tubes, ovaries and perineal washings showed no evidence of tumor.  The degree of invasion of the carcinoma was 0.  It was all within the endometrium.  Other laboratory work was within normal range.  DISCHARGE DIAGNOSIS:  Adenocarcinoma of the endometrium, International Federation of Gynecology and Obstetrics (FIGO) grade 1.  PROCEDURE:  Total abdominal hysterectomy with bilateral salpingo-oophorectomy. DD:  06/19/00 TD:  06/19/00 Job: [id number]

## 2010-06-14 NOTE — Assessment & Plan Note (Signed)
[facility]                           GASTROENTEROLOGY OFFICE NOTE   NAME:[name]                        MRN:          [id number]  DATE:11/05/2005                            DOB:          1940/05/23    [name] is a 70 year old white female with severe interstitial cystitis  status post cystectomy by Dr. Logan Bores with neobladder formation.  She also has  a severe irritable bowel syndrome, initially followed by Dr. Laural Benes, most  recently by myself.  Her irritable bowel syndrome treatment has been limited  by intolerance to most of the medications, including antispasmodic,  __________ and antibiotics.  She was hospitalized 3 weeks ago with a severe  abdominal pain, nausea, vomiting at [facility] and was discharged 48 hour  later on Xifaxan antibiotic as prescribed by Dr. Russella Dar.  After taking  Xifaxan for irritable bowel syndrome for 3 days patient became even sicker  and had to discontinue the medication.  She came today to discuss her  medical regimen.   PHYSICAL EXAMINATION:  Blood pressure 140/70.  Pulse 80 and weight 147  pounds, which represents a 6 pound weight gain.  She is extremely anxious, she wore braces.  Patient was talking constantly.  LUNGS:  Clear to auscultation.  COR:  With normal S1, S2.  ABDOMEN:  Soft, but extremely tender in all quadrants, including subpubic  area, left and right upper quadrants.  Bowel sounds were normoactive, no  rebound.  RECTAL:  Heme negative stool.   IMPRESSION:  71. A 70 year old white female with severe irritable bowel syndrome and a      medication intolerance.  She may be due for another colonoscopy but I      am concerned that she would not tolerate the prep, which is likely to      exacerbate her irritable bowel syndrome.  For that reason I would hold      off in the diagnostic studies.  2. She had upper endoscopy just 4 months ago and had no ulcer, so I do not      believe that this is  actually a structural problem of the upper      gastrointestinal tract.  3. Continue Protonix 40 mg a day, Zofran 4 mg every 8 hours as needed.      Start Levsin sublingually 0.125 mg twice a day on a regular basis.      Continue Flora-Q 1 p.o. daily.   I would be interested in knowing from Dr. Logan Bores whether Elmiron instillation  in the bladder may effect the bowel motility.  She uses it every 3 to 4 days  and I asked the patient to write a diary  as to whether there are any gastrointestinal side effects of Elmiron  infusion which probably would present within 24 hours of the instillation.  I will see her again in 6 weeks.       Hedwig Morton. Juanda Chance, MD      DMB/MedQ  DD:  11/05/2005  DT:  11/07/2005  Job #:  [id number]   cc:   Jamison Neighbor,  M.D.  Elsworth Soho, M.D.

## 2010-06-14 NOTE — Op Note (Signed)
NAME:  [name], [name]                          ACCOUNT NO.:  [id number]   MEDICAL RECORD NO.:  [id number]                   PATIENT TYPE:  AMB   LOCATION:  [facility]                                 FACILITY:  [facility]   PHYSICIAN:  Jamison Neighbor, M.D.               DATE OF BIRTH:  1940-04-08   DATE OF PROCEDURE:  09/12/2003  DATE OF DISCHARGE:                                 OPERATIVE REPORT   PREOPERATIVE DIAGNOSES:  1. Interstitial cystitis.  2. Gross hematuria.   POSTOPERATIVE DIAGNOSES:  1. Interstitial cystitis.  2. Gross hematuria.   PROCEDURES:  1. Cystoscopy.  2. Urethral calibration.  3. Hydrodistention of the bladder.  4. Cauterization of Hunner's ulcers.  5. Marcaine and Pyridium instillation.  6. Marcaine and Kenalog injection.   SURGEON:  Jamison Neighbor, M.D.   ANESTHESIA:  General.   COMPLICATIONS:  None.   DRAINS:  None.   BRIEF HISTORY:  This 70 year old female is known to have interstitial  cystitis.  She has recently developed an increase in her symptoms along with  hematuria.  The patient underwent a CT scan which showed no abnormalities  other than one single lymph node that has been stable for over the past  three years.  The patient is now to undergo cystoscopy to re-evaluate her  hematuria and to determine if there is any change in her bladder.  The  patient is really desperate to improve her interstitial cystitis symptoms  and has requested that consideration be given to cystectomy.  She was  advised that that could only be considered seriously if the patient's  bladder capacity has deteriorated tremendously and if the bladder looked  significantly worse than it did previously.  The patient understands the  risks and benefits of the procedure, including the fact that there is no  guarantee she will have a change in her symptoms with distention.  She gave  full informed consent.   PROCEDURE:  After successful induction of general anesthesia, the  patient  was placed in the dorsal lithotomy position and prepped with Betadine and  draped in the usual sterile fashion.  The patient has massive prolapsed  hemorrhoids and will clearly need to have these taken care of soon.  The  patient was noted to be bleeding slightly from the hemorrhoids when the B&O  suppository was inserted prior to preparation.  The patient has a modest  cystocele, no significant rectocele, and no mass on bimanual exam.  The  urethra was calibrated to 78 [place] with female urethral sounds.  The  cystoscope was inserted and the bladder was carefully inspected.  It was  free of any tumor or stones.  Both ureteral orifices were normal in  configuration and location.  The bladder was distended at a pressure of 100  cmH2O for five minutes.  When the bladder was drained, glomerulations could  be seen throughout the bladder.  The bladder was 400 mL, which is markedly  diminished.  There were one or two Hunner's ulcers on the right-hand side  that were quite distinct.  These were cauterized using some of the  techniques described by Dr. Dartha Lodge.  The bladder was drained and a  mixture of Marcaine and Pyridium was left in the bladder, Marcaine and  Kenalog were injected periurethrally.  The patient had requested  preoperatively that no packing be placed in the vagina, so pressure was  applied to make sure there was no significant bleeding from the biopsy site.  The patient tolerated the procedure well and was taken to the recovery room  in good condition.  She will be sent home with a prescription for pain  medication as well as Pyridium Plus and Septra DS for a few days and will  return to my office in follow-up.                                               Jamison Neighbor, M.D.    RJE/MEDQ  D:  09/12/2003  T:  09/12/2003  Job:  [id number]

## 2010-06-14 NOTE — Op Note (Signed)
NAME:  [name], [name]                          ACCOUNT NO.:  [id number]   MEDICAL RECORD NO.:  [id number]                   PATIENT TYPE:  AMB   LOCATION:  [facility]                                 FACILITY:  [facility]   PHYSICIAN:  Jamison Neighbor, M.D.               DATE OF BIRTH:  July 26, 1940   DATE OF PROCEDURE:  04/07/2003  DATE OF DISCHARGE:                                 OPERATIVE REPORT   SERVICE:  Urology.   PREOPERATIVE DIAGNOSES:  Interstitial cystitis.  Secondary diagnosis is  hematuria.   Hematuria.   POSTOPERATIVE DIAGNOSES:  Interstitial cystitis.   PROCEDURE:  Cystoscopy, urethral calibration, bilateral retrogrades with  interpretation, hydrodistention of the bladder, Marcaine and Pyridium  installation, Marcaine and Kenalog injection.   SURGEON:  Jamison Neighbor, M.D.   ANESTHESIA:  General.   COMPLICATIONS:  None.   DRAINS:  None.   BRIEF HISTORY:  This 69 year old female is known to have a severe case of  interstitial cystitis. In addition, she has recently developed increasing  problems with hematuria and associated left sided pain. The patient has had  a CT scan which looks unremarkable but it is felt that retrograde studies  might be helpful at the same time she undergoes therapeutic hydrodistention.  The patient has responded to hydrodistention in the past. She is known to  have a severe case of interstitial cystitis that has not responded very well  to installation therapy or oral therapy. She is allergic to almost forms of  medications delivered by mouth. The patient understands the risks and  benefits of the procedure and gave full and informed consent.   DESCRIPTION OF PROCEDURE:  After successful induction of general anesthesia,  the patient was placed in the dorsal lithotomy position, prepped with  Betadine and draped in the usual sterile fashion.  Careful bimanual  examination revealed no palpable masses, the urethra was in a normal  position with  no diverticulum.  There was on significant cystocele or  rectocele detected. The urethra was calibrated to 73 [place] with female  urethral sounds with no evidence of stenosis or stricture. The cystoscope  was inserted, the bladder was carefully inspected and was free of any tumor  or stones. Both ureteral orifices were normal in configuration and location.  Clear urine was seen to come from each. Retrograde studies performed on each  side with an open end catheter. The patient had normal upper tracts on each  side with no filling defects, no hydronephrosis and no delay in the drain  out films.  It was felt that the hematuria was likely coming from the  bladder and is most likely due to the patient's ongoing interstitial  cystitis.  Hydrodistention was then performed, the bladder was filled to 100  cm of pressure. The patient's bladder was then drained, she held only 400 mL  and there were glomerulations and ulcers throughout the bladder as  well as  definite thinning on the left hand side. The patient had Marcaine and  Pyridium placed within the bladder.  Marcaine and Kenalog were injected  periurethrally for a postoperative block. The patient received  intraoperative Toradol and  Zofran as well as a B & O suppository. She will be sent home with a  prescription for Klonopin which has been one of her longstanding medications  as well as Levaquin and Lorcet for pain management. The patient tolerated  the procedure well and was taken to the recovery room in good condition.                                               Jamison Neighbor, M.D.    RJE/MEDQ  D:  04/07/2003  T:  04/07/2003  Job:  [id number]

## 2010-06-14 NOTE — Op Note (Signed)
[name], [name]                 ACCOUNT NO.:  [id number]   MEDICAL RECORD NO.:  [id number]          PATIENT TYPE:  INP   LOCATION:  0362                         FACILITY:  [facility]   PHYSICIAN:  Jamison Neighbor, M.D.  DATE OF BIRTH:  07-18-1940   DATE OF PROCEDURE:  11/10/2003  DATE OF DISCHARGE:                                 OPERATIVE REPORT   PREOPERATIVE DIAGNOSIS:  End-stage interstitial cystitis.   POSTOPERATIVE DIAGNOSIS:  End-stage interstitial cystitis.   PROCEDURE:  Cystectomy at ileal neobladder Lenon Ahmadi type).   SURGEON:  Jamison Neighbor, M.D.   ASSISTANT:  Lindaann Slough, M.D.   ANESTHESIA:  General.   COMPLICATIONS:  None.   DRAINS:  1.  One 20 French Silastic Foley in neobladder  2.  Bilateral single-J suture to the Foley.  3.  Bilateral #10 Blake drains.  4.  NG tube.   ESTIMATED BLOOD LOSS:  250 mL.   HISTORY:  This is a 70 year old female, who has end-stage interstitial  cystitis.  The patient is known to have a bladder capacity of under 250 mL  under anesthesia and has severe frequency and chronic pelvic pain.  The  patient has voiding frequency as often as every half hour and is quite  incapacitated.  After careful discussion about numerous options, the patient  feels that there is nothing left that can be done other than to undergo a  cystectomy.  Her quality of life is poor, and she feels that this is the  appropriate thing for her to do at this time.  We have had a careful  discussion of the pros and cons and given the severity of her bladder, her  lack of response to any form of oral therapy, or instillation therapy, and  the lack of any other experimental options on the horizon, I feel that the  patient's decision is quite appropriate.  The patient is a Multimedia programmer.  We have discussed the pros and cons of this procedure in light of  her religious beliefs in great detail.  She is unwilling to consider a blood  transfusion and is aware of  the major nature of this surgery.  She is,  however, willing to undergo Cell Saver blood salvage.  She has agreed to  accept the Cell Saver transfusion if appropriate and agrees that she is  willing to take the risk of a cystectomy without having blood available.  She understands the risks and benefits of the procedure including the very  specific possibility that her pelvic pain may be persistent even after the  cystectomy.  Full informed consent was obtained.   DESCRIPTION OF PROCEDURE:  The patient was brought into the operating room  one night in advance in order to complete her bowel prep.  A complete  mechanical and antibiotic bowel prep was performed.  The patient's  preoperative laboratories were assessed, and she was felt to be appropriate  for surgery.  She was brought to the operating room, placed in a modified  frogleg position, and adequate general anesthesia was obtained.  The patient  had a previous lower midline incision from gynecologic surgery.  This  incision was utilized and was taken from the symphysis pubis up to the  umbilicus.  This was carried down sharply through the Scarpa's fascia until  the rectus sheath was identified.  The rectus sheath was opened in the  midline, giving entry into the peritoneum.  The peritoneum was explored.  The adhesions were taken down, and the bowel was retracted superiorly.  There were one or two places where the bowel was stuck to the underside of  the abdominal wall; these were taken down as well.  The Bookwalter retractor  was then placed and used to hold the bowel away from the operative field.  The attachments to the peritoneum to the sidewall were carefully taken down.  The ureters were identified.  They were separated from the surrounding  tissue.  They were doubly clipped and ligated.  Using the LigaSure as well  as silk ties and surgical clips, the pedicles were carefully taken down on  both sides.  Electrocautery was used to  dissect the bladder off the top of  the vagina which was completely preserved.  The dissection proceeded on top  of the vagina, all the way down until just the urethra remained.  The  urethra was transected at the bladder neck, and the specimen was removed.  The patient had one area adjacent to the bladder neck that required a suture  ligature for hemostasis but otherwise, the hemostasis during the entire  procedure was excellent.  The patient had a Foley catheter left in place  which was used to snug up against the urethra in case there was any bleeding  from that area during the creation of the neobladder.  The right colon was  then identified.  The patient's appendix appeared unremarkable, and it was  felt this should be left alone.  An area approximately 12 cm from the  terminal ileum was identified.  This was the site that was selected for the  first transection of the bowel.  The area was marked out with marking  sutures and then transected with the GIA.  The mesentery was taken down with  the LigaSure.  A bowel section 72 cm in length was then selected, and the  GIA was used again.  The bowel continuity was reestablished in the usual  fashion with the GIA and a TA55, and this was oversewn with silk sutures,  and the mesenteric trap was also closed with silk sutures.  The first 60 cm  of the ileum was then opened.  The segment that contained staples was cut  away so that the staples were removed.  The 60 cm segment was then  reconfigured in the three loops so that the pseudobladder could be created.  The last 12 remained as a small spout for the Studer limb.  The back wall  was then created by closing three separate suture lines with running sutures  of 2-0 Vicryl.  The neobladder was then created by rolling the back wall  around to the front to create a sphere, and this front wall was then closed  all the way up to the top just below the [facility] bladder.  The very top of the pouch was  left open at this point.  The ureters had been previously  identified and clipped.  Potts scissors then used to spatulate the ureters.  The ureters were attached to the Studer limb in the usual fashion with 4-0  Vicryl.  The single-J catheters that were utilized were sutured to the  ureters with 5-0 chromic.  The single-J catheters came through the  anastomosis of the Studer limb and then down into the pouch itself.   Attention was then directed to the urethra.  Five separate sutures of double-  arm Monocryl were then placed in identical fashion that utilized for a  radical prostatectomy.  These sutures were then brought over to the  neobladder through a small stab incision and were placed in the bladder  neck.  The silicon catheter was then passed through the urethra and up into  the neobladder.  It was then sutured to the single-J catheters with a silk  suture so that when the catheter was removed, the single-J's will come out  as well.  The anastomosis was then performed, snugging the catheter down.  The top of the bladder was then closed with a final 2-0 Vicryl.  Irrigation  showed no leak at the anastomosis through the urethra and no leak at any of  the suture lines.  The area was carefully inspected.  Adequate hemostasis  had been obtained.  It was noted that during the procedure, there really was  very little bleeding, and not much was obtained through the Cell Saver.  The  Cell Saver was used up to the point where the bowel was opened, and it  appeared that less than 300 mL of blood loss had been obtained.  Once the  Cell Saver was utilized, it appeared that a volume of no more than 150 mL  would be obtained, and it was felt that that was not appropriate for  transfusion, so that was not utilized.  A final inspected showed that  everything appeared to be in order.  The anastomosis looked good.  The two  ureters were in a very retroperitoneal position.  The bowel was not kinked  in  any  way.  The NG tube was in appropriate position.  The patient was then closed  with two running sutures of #1 PDS.  The skin was closed with surgical clips  after irrigation.  The patient had two Blake drains sutured in place with 2-  0 silk.      RJE/MEDQ  D:  11/10/2003  T:  11/10/2003  Job:  [id number]   cc:   L. Lupe Carney, M.D.  [address] [place]  [place] [address]  Fax: [phone]

## 2010-06-14 NOTE — H&P (Signed)
[name], [name]                 ACCOUNT NO.:  [id number]   MEDICAL RECORD NO.:  [id number]          PATIENT TYPE:  INP   LOCATION:  0362                         [facility]ACILITY[facility]   PHYSICIAN:  Jamison Neighbor, M.D.  DATE OF BIRTH:  Mar 08, 1940   DATE OF ADMISSION:  11/09/2003  DATE OF DISCHARGE:                                HISTORY & PHYSICAL   ADMISSION DIAGNOSIS:  End-stage interstitial cystitis.   SECONDARY DIAGNOSES:  1.  Irritable bowel syndrome.  2.  Fibromyalgia.  3.  Temporomandibular joint syndrome.  4.  History of cervical cancer.  5.  Chronic pelvic pain.   HISTORY:  This 70 year old female is known to have severe interstitial  cystitis.  The patient has had numerous cystoscopic examinations and earlier  in the year was found to have a bladder capacity that had significantly  decreased to less than 300 mL.  Despite aggressive use of medications,  irrigations, etc., the patient has really not responded well to therapy.  She requires narcotics on a regular basis and because of the small size of  her bladder, finds that she has to urinate as often as every half hour.  The  patient has considered all the pros and cons and elected to undergo  cystectomy.  Her situation is remarkable for the fact that she is a  Scientist, product/process development.  She is, however, willing to use the Cell Saver.  She is  certainly aware of the potential risks of not accepting blood but feels that  this is the surgery that she needs and is willing to take the risk in order  to have the cystectomy.  She is certainly aware of the fact that there is no  guarantee that this will help her chronic pelvic pain but should help to  give her a better quality of life by eliminating her severe urinary  frequency.  The patient was admitted one night prior to the procedure in  order to complete a bowel prep.   PAST MEDICAL HISTORY:  Remarkable for interstitial cystitis, TMJ syndrome,  and fibromyalgia, all of which are  known to be associated with interstitial  cystitis.  She also has problems with headaches, although they do not appear  to be true migraine headaches.  The patient is status post hysterectomy for  cervical cancer back in 2002.  She had back surgery back in 1994.  She had  cystoscopies on several occasions, the most recent one being in August 2005.  She has also had tonsillectomy as a child.   At the time of admission, the patient's medications were Klonopin, Protonix,  and hydrocodone.   SOCIAL HISTORY:  Unremarkable.  She does not use tobacco or alcohol.   REVIEW OF SYSTEMS:  She did have a heart murmur as a child.  She does have  occasional headaches, particularly when she takes pain medication.  She has  alternating diarrhea and constipation due to her irritable bowel syndrome.  She, of course, has severe urgency, frequency, hematuria, nocturia, and  dysuria secondary to her interstitial cystitis.  She is known to have  some  diminished hearing on the left-hand side, which she feels is due to her TMJ  syndrome.  She also has trouble emptying her mouth at times.  She has pain  throughout the upper and lower extremities secondary to both the IC and her  fibromyalgia.   PHYSICAL EXAMINATION:  GENERAL:  She is a well-developed, well-nourished  female in no acute distress.  HEENT:  Pertinent for the TMJ syndrome but was otherwise unremarkable.  NECK:  Supple with no adenopathy or thyromegaly.  MUSCULOSKELETAL:  The patient does have pain in the upper and lower  extremities and up and down the spine consistent with her fibromyalgia.  She  had no flank mass or tenderness.  CHEST:  Her lungs were clear.  CARDIAC:  Heart had a regular rate and rhythm without murmurs, thrills,  gallops, rubs, or heaves.  ABDOMEN:  Soft, nontender, with no palpable masses, rebound, or guarding.  The only exception was that just above the pubic bone, she is very tender.  She has a well-healed lower midline  incision from her gynecologic procedure.  PELVIC:  Vaginal exam shows no cystocele, rectocele, or enterocele.  There  is no mass on bimanual exam.  The bladder was quite tender.  EXTREMITIES:  No cyanosis, clubbing, or edema.  NEUROLOGIC, VASCULAR:  Intact.  As noted above, she has clear-cut evidence  of fibromyalgia.   IMPRESSION:  1.  Interstitial cystitis.  2.  Irritable bowel syndrome.  3.  Fibromyalgia.  4.  Chronic pelvic pain.   PLAN:  Admit in preparation for a planned cystectomy.      RJE/MEDQ  D:  11/10/2003  T:  11/10/2003  Job:  [id number]   cc:   L. Lupe Carney, M.D.  [address] [place]  [place] [address]  Fax: [phone]

## 2010-06-14 NOTE — Op Note (Signed)
[facility]  Patient:    [name], [name]                  MRN: [id number] Proc. Date: 06/17/00 Adm. Date:  [id number] Attending:  Lanna Poche CC:         Darcella Cheshire, M.D. (2 copies)   Operative Report  PREOPERATIVE DIAGNOSIS:  Adenocarcinoma of the endometrium.  POSTOPERATIVE DIAGNOSIS:  Adenocarcinoma of the endometrium.  OPERATION: 1. Total abdominal hysterectomy. 2. Bilateral salpingo-oophorectomy.  SURGEON:  Darcella Cheshire, M.D.  DESCRIPTION OF PROCEDURE:  After satisfactory general anesthesia obtained and patient placed on the table in lithotomy position, the perineum and vagina prepped with Betadine solution and draped in the usual manner for a lower midline incision.  Incision was made and carried through in the usual fashion into the abdominal cavity right instrument.  There was no fluid or blood within the abdominal cavity.  Peritoneal washing were obtained.  Examination revealed  uterus which was tiny, small, deep down in the pelvis.  Both ovaries were tiny, small.  The tissue was packed away using Oconnor-OSullivan retractor, and the uterus was grasped using Kocher clamps at either cornual areas so that it could be elevated.  Routine hysterectomy was done using the clamp, cut, and tie technique, moving down to the cervix.  The bladder was pushed away gently, and the dissection carried on down through the vagina. The uterus removed from the operative field and sent immediately to the laboratory.  Dr. Guilford Shi had been prewarned about the uterus being sent to him. The tubes and ovaries were then removed, beginning on the right side.  The infundibulopelvic ligament was doubly ligated.  The tissue then cut away. Bleeding points were secured.  The same procedure was done on the left side. Both tubes and ovaries were removed.  The area was then reperitonealized using 2-0 plain catgut running interlocking suture.  Hemostasis  following the procedure was excellent.  About this time, Dr. Guilford Shi called back indicating that everything looked good within the uterus, and he saw no invasive cancer. It was, therefore, unnecessary to proceed with any nodes.  The discharge and blood was removed from the pelvis, the large bowel placed down into the pelvic area, and the parietal peritoneum closed with #1 chromic catgut suture, the fascia with 2-0 Dexon running and interrupted suture.  Finally, the subcutaneous fat was closed with interrupted plain catgut sutures, and the skin edges were reapproximated with the skin stapling device.  The patient tolerated the procedure well.  Blood loss was no greater than 150 cc.  The patient was sent to the recovery room in good condition. DD:  06/17/00 TD:  06/17/00 Job: [id number]

## 2010-06-14 NOTE — Discharge Summary (Signed)
[name], [name]                 ACCOUNT NO.:  [id number]   MEDICAL RECORD NO.:  [id number]          PATIENT TYPE:  INP   LOCATION:  1519                         [facility]ACILITY:  [facility]   PHYSICIAN:  Jackie Plum, M.D.DATE OF BIRTH:  1940-06-04   DATE OF ADMISSION:  10/17/2005  DATE OF DISCHARGE:  10/19/2005                                 DISCHARGE SUMMARY   DISCHARGE DIAGNOSES:  1. Acute exacerbation of chronic abdominal pain with nausea and vomiting,      resolving.  2. History of irritable bowel syndrome.  3. History of reconstructive bladder surgery by Dr. Logan Bores.  4. History of chronic interstitial cystitis.  5. History of fibromyalgia.  6. Chronic anxiety.  7. Vulvodynia.  8. History of cervical malignancy.   DISCHARGE MEDICATIONS:  Patient is going to resume her preadmission  medications as previously.  These medicines are clonazepam, Ultram, Vicodin  p.r.n., Levsin, nitrofurantoin.  According to GI notes written today, she  reports also to be on Xifaxan.   CONSULTATIONS:  Dr. Russella Dar of [facility].   CONDITION ON DISCHARGE:  Improved, satisfactory.   REASON FOR ADMISSION:  Acute abdominal pain.   Patient presented to the ED with abdominal pain proceeded by nausea and an  episode of vomiting on the day of admission.  In the emergency room, the  patient has x-ray of the abdomen was done, which revealed no acute changes  __________ symptoms.  She therefore is admitted for fluid  resuscitation/rehydration and pain control and symptom management for her  upper GI symptoms.  Please see full  history regarding patient's  presentation by reviewing the H&P dictated by me on October 17, 2005.  On  admission, the patient was started on IV fluids as well as analgesics,  antiemetics, and __________ with some improvement.  She was seen by Dr.  Russella Dar of [facility] in consultation on the same day of admission.  He  recommended awaiting for stool studies, which have all came back  negative,  which apparently no sample could be taken.  She had been initiated on bowel  rest and was continued.  Overnight, the patient improved somewhat but still  had some upper abdominal pain; therefore, she was continued on analgesics,  and she was tried on ambulation and dietary trials with improvement.  She  was seen on rounds today.  Patient indicated that she is significantly  improved.  She is not having any significant nausea.  She has not had any  vomiting.  The abdominal pain is nearly resolved.  She has been ambulating  without any problems, and she feels ready to go home.  Patient has been seen  by Dr. Russella Dar, who agrees with discharge.   PHYSICAL EXAMINATION:  GENERAL:  Patient is alert and oriented.  VITAL SIGNS:  Stable.  Afebrile.  CARDIOPULMONARY:  Unremarkable.  ABDOMEN:  Soft.  Bowel sounds present.  She had mild epigastric tenderness,  otherwise no rebound tenderness or guarding.  EXTREMITIES:  No cyanosis.   She is going to be discharged home to follow up with Dr. Juanda Chance, her primary  gastroenterologist.  Patient is discharged in stable, satisfactory  condition.   Lab work of note, CT scan of the abdomen and pelvis indicated mild  hydronephrosis.  Patient will need to be following up with Dr. Logan Bores on  that.   Her discharge labs indicated TSH of 2.229.  Her BMET and CBC were not  repeated.  Fecal occult blood was negative.      Jackie Plum, M.D.  Electronically Signed     GO/MEDQ  D:  10/19/2005  T:  10/21/2005  Job:  [id number]

## 2011-02-20 DIAGNOSIS — J209 Acute bronchitis, unspecified: Secondary | ICD-10-CM | POA: Diagnosis not present

## 2011-02-26 ENCOUNTER — Other Ambulatory Visit: Payer: Self-pay | Admitting: Urology

## 2011-02-26 DIAGNOSIS — Z87440 Personal history of urinary (tract) infections: Secondary | ICD-10-CM

## 2011-02-26 DIAGNOSIS — N3020 Other chronic cystitis without hematuria: Secondary | ICD-10-CM | POA: Diagnosis not present

## 2011-02-26 DIAGNOSIS — N1330 Unspecified hydronephrosis: Secondary | ICD-10-CM

## 2011-02-26 HISTORY — DX: Personal history of urinary (tract) infections: Z87.440

## 2011-02-28 ENCOUNTER — Ambulatory Visit
Admission: RE | Admit: 2011-02-28 | Discharge: 2011-02-28 | Disposition: A | Discharge status: Home / Self Care | Payer: Medicare Other | Source: Ambulatory Visit | Attending: Urology | Admitting: Urology

## 2011-02-28 DIAGNOSIS — N1330 Unspecified hydronephrosis: Secondary | ICD-10-CM

## 2011-02-28 DIAGNOSIS — N200 Calculus of kidney: Secondary | ICD-10-CM | POA: Diagnosis not present

## 2011-06-17 DIAGNOSIS — K2970 Gastritis, unspecified, without bleeding: Secondary | ICD-10-CM | POA: Diagnosis not present

## 2011-06-17 DIAGNOSIS — F411 Generalized anxiety disorder: Secondary | ICD-10-CM | POA: Diagnosis not present

## 2011-06-17 DIAGNOSIS — IMO0001 Reserved for inherently not codable concepts without codable children: Secondary | ICD-10-CM | POA: Diagnosis not present

## 2011-06-17 DIAGNOSIS — K589 Irritable bowel syndrome without diarrhea: Secondary | ICD-10-CM | POA: Diagnosis not present

## 2011-06-17 DIAGNOSIS — J309 Allergic rhinitis, unspecified: Secondary | ICD-10-CM | POA: Diagnosis not present

## 2011-06-17 DIAGNOSIS — K2990 Gastroduodenitis, unspecified, without bleeding: Secondary | ICD-10-CM | POA: Diagnosis not present

## 2011-08-01 DIAGNOSIS — Z1231 Encounter for screening mammogram for malignant neoplasm of breast: Secondary | ICD-10-CM | POA: Diagnosis not present

## 2011-08-20 DIAGNOSIS — J019 Acute sinusitis, unspecified: Secondary | ICD-10-CM | POA: Diagnosis not present

## 2011-08-20 DIAGNOSIS — F411 Generalized anxiety disorder: Secondary | ICD-10-CM | POA: Diagnosis not present

## 2011-09-05 DIAGNOSIS — J019 Acute sinusitis, unspecified: Secondary | ICD-10-CM | POA: Diagnosis not present

## 2011-12-18 DIAGNOSIS — F411 Generalized anxiety disorder: Secondary | ICD-10-CM | POA: Diagnosis not present

## 2011-12-18 DIAGNOSIS — J019 Acute sinusitis, unspecified: Secondary | ICD-10-CM | POA: Diagnosis not present

## 2011-12-18 DIAGNOSIS — M2550 Pain in unspecified joint: Secondary | ICD-10-CM | POA: Diagnosis not present

## 2011-12-18 DIAGNOSIS — K2990 Gastroduodenitis, unspecified, without bleeding: Secondary | ICD-10-CM | POA: Diagnosis not present

## 2011-12-18 DIAGNOSIS — Z0000 Encounter for general adult medical examination without abnormal findings: Secondary | ICD-10-CM | POA: Diagnosis not present

## 2011-12-18 DIAGNOSIS — E7800 Pure hypercholesterolemia, unspecified: Secondary | ICD-10-CM | POA: Diagnosis not present

## 2012-01-09 DIAGNOSIS — R51 Headache: Secondary | ICD-10-CM | POA: Diagnosis not present

## 2012-01-12 ENCOUNTER — Other Ambulatory Visit: Payer: Self-pay | Admitting: Family Medicine

## 2012-01-12 ENCOUNTER — Ambulatory Visit
Admission: RE | Admit: 2012-01-12 | Discharge: 2012-01-12 | Disposition: A | Discharge status: Home / Self Care | Payer: Medicare Other | Source: Ambulatory Visit | Attending: Family Medicine | Admitting: Family Medicine

## 2012-01-12 DIAGNOSIS — R51 Headache: Secondary | ICD-10-CM

## 2012-01-16 DIAGNOSIS — M2669 Other specified disorders of temporomandibular joint: Secondary | ICD-10-CM | POA: Diagnosis not present

## 2012-01-16 DIAGNOSIS — R51 Headache: Secondary | ICD-10-CM | POA: Diagnosis not present

## 2012-02-13 DIAGNOSIS — R51 Headache: Secondary | ICD-10-CM | POA: Diagnosis not present

## 2012-02-13 DIAGNOSIS — G43909 Migraine, unspecified, not intractable, without status migrainosus: Secondary | ICD-10-CM | POA: Diagnosis not present

## 2012-02-16 ENCOUNTER — Other Ambulatory Visit: Payer: Self-pay | Admitting: Diagnostic Neuroimaging

## 2012-02-16 DIAGNOSIS — R51 Headache: Secondary | ICD-10-CM

## 2012-02-16 DIAGNOSIS — G43909 Migraine, unspecified, not intractable, without status migrainosus: Secondary | ICD-10-CM

## 2012-02-27 ENCOUNTER — Ambulatory Visit
Admission: RE | Admit: 2012-02-27 | Discharge: 2012-02-27 | Disposition: A | Discharge status: Home / Self Care | Payer: Medicare Other | Source: Ambulatory Visit | Attending: Diagnostic Neuroimaging | Admitting: Diagnostic Neuroimaging

## 2012-02-27 DIAGNOSIS — J019 Acute sinusitis, unspecified: Secondary | ICD-10-CM | POA: Diagnosis not present

## 2012-02-27 DIAGNOSIS — G43909 Migraine, unspecified, not intractable, without status migrainosus: Secondary | ICD-10-CM

## 2012-02-27 DIAGNOSIS — R51 Headache: Secondary | ICD-10-CM

## 2012-03-05 ENCOUNTER — Other Ambulatory Visit: Payer: Medicare Other

## 2012-05-03 DIAGNOSIS — H01009 Unspecified blepharitis unspecified eye, unspecified eyelid: Secondary | ICD-10-CM | POA: Diagnosis not present

## 2012-05-07 DIAGNOSIS — R3989 Other symptoms and signs involving the genitourinary system: Secondary | ICD-10-CM | POA: Diagnosis not present

## 2012-05-19 ENCOUNTER — Other Ambulatory Visit: Payer: Self-pay | Admitting: Urology

## 2012-05-19 DIAGNOSIS — N3020 Other chronic cystitis without hematuria: Secondary | ICD-10-CM | POA: Diagnosis not present

## 2012-05-19 DIAGNOSIS — Z9889 Other specified postprocedural states: Secondary | ICD-10-CM | POA: Diagnosis not present

## 2012-05-19 DIAGNOSIS — Z906 Acquired absence of other parts of urinary tract: Secondary | ICD-10-CM

## 2012-05-19 HISTORY — DX: Acquired absence of other parts of urinary tract: Z90.6

## 2012-05-21 ENCOUNTER — Ambulatory Visit
Admission: RE | Admit: 2012-05-21 | Discharge: 2012-05-21 | Disposition: A | Discharge status: Home / Self Care | Payer: Medicare Other | Source: Ambulatory Visit | Attending: Urology | Admitting: Urology

## 2012-05-21 DIAGNOSIS — Z9889 Other specified postprocedural states: Secondary | ICD-10-CM

## 2012-05-21 DIAGNOSIS — N3020 Other chronic cystitis without hematuria: Secondary | ICD-10-CM

## 2012-05-21 DIAGNOSIS — N200 Calculus of kidney: Secondary | ICD-10-CM | POA: Diagnosis not present

## 2012-06-11 DIAGNOSIS — E7800 Pure hypercholesterolemia, unspecified: Secondary | ICD-10-CM | POA: Diagnosis not present

## 2012-07-23 ENCOUNTER — Other Ambulatory Visit: Payer: Self-pay | Admitting: Internal Medicine

## 2012-08-02 ENCOUNTER — Ambulatory Visit (INDEPENDENT_AMBULATORY_CARE_PROVIDER_SITE_OTHER): Payer: Medicare Other | Admitting: Internal Medicine

## 2012-08-02 ENCOUNTER — Encounter: Payer: Self-pay | Admitting: Internal Medicine

## 2012-08-02 VITALS — BP 130/72 | HR 78 | Ht 65.0 in | Wt 145.0 lb

## 2012-08-02 DIAGNOSIS — R1012 Left upper quadrant pain: Secondary | ICD-10-CM

## 2012-08-02 DIAGNOSIS — K589 Irritable bowel syndrome without diarrhea: Secondary | ICD-10-CM

## 2012-08-02 DIAGNOSIS — Z86010 Personal history of colonic polyps: Secondary | ICD-10-CM

## 2012-08-02 MED ORDER — PANTOPRAZOLE SODIUM 40 MG PO TBEC: DELAYED_RELEASE_TABLET | ORAL | Status: DC

## 2012-08-02 NOTE — Patient Instructions (Addendum)
We have sent the following medications to your pharmacy for you to pick up at your convenience: pantoprazole  CC: Dr Clovis Riley, Dr Marcelyn Bruins

## 2012-08-02 NOTE — Progress Notes (Signed)
[name] 1940-03-12 MRN [id number]   History of Present Illness:  This is a 72 year old white female with known irritable bowel syndrome dyspepsia and nausea. Her last appointment was in April 2012. She has severe interstitial cystitis followed by Dr. Logan Bores. She underwent a total cystectomy and creation of a neobladder. She has multiple drug sensitivities, especially antibiotics. We have evaluated her GI tract with an upper endoscopy in April 2007 which was normal including small bowel biopsy. She had a hyperplastic polyp on  colonoscopy in 2002 and has a family history of colon polyps in her sister. Her last colonoscopy in April 2012 was normal. She ran out of Protonix. She has not taken it for about a year. She has recently developed nausea and epigastric discomfort but did not have any Protonix at home.   Past Medical History  Diagnosis Date  . Anxiety   . Fibromyalgia   . History of colon polyps     hyperplastic  . History of uterine cancer   . Irritable bowel syndrome   . Chronic interstitial cystitis   . Cataract    Past Surgical History  Procedure Laterality Date  . Abdominal hysterectomy    . Bladder surgery    . Tonsillectomy    . Back surgery      L5  . Cataract surgery ou      reports that she has never smoked. She has never used smokeless tobacco. She reports that  drinks alcohol. She reports that she does not use illicit drugs. family history includes Breast cancer in her cousin, maternal aunt, and sister; Colon polyps in her sister; and Lung cancer in her father.  There is no history of Colon cancer. No Known Allergies      Review of Systems: Positive for nausea but no vomiting. Occasional diarrhea.  The remainder of the 10 point ROS is negative except as outlined in H&P   Physical Exam: General appearance  Well developed, in no distress. Eyes- non icteric. HEENT nontraumatic, normocephalic. Mouth no lesions, tongue papillated, no cheilosis. Neck supple  without adenopathy, thyroid not enlarged, no carotid bruits, no JVD. Lungs Clear to auscultation bilaterally. Cor normal S1, normal S2, regular rhythm, no murmur,  quiet precordium. Abdomen: Diffusely tender abdomen in all quadrants especially periumbilical area. Normal active bowel sounds. No tympany or distention. Rectal: Soft Hemoccult negative stool. Extremities no pedal edema. Skin no lesions. Neurological alert and oriented x 3. Psychological normal mood and affect.  Assessment and Plan:  Problem #57 72 year old white female with irritable bowel syndrome with recent exacerbation including dyspepsia. She has Levsin sublingual 0.125 mg at home to use when necessary. We will also refill her Protonix 40 mg daily with one year of refills.No  Tests planned.   08/02/2012 [name]

## 2012-08-06 DIAGNOSIS — Z1231 Encounter for screening mammogram for malignant neoplasm of breast: Secondary | ICD-10-CM | POA: Diagnosis not present

## 2012-08-23 DIAGNOSIS — M722 Plantar fascial fibromatosis: Secondary | ICD-10-CM | POA: Diagnosis not present

## 2012-09-29 DIAGNOSIS — M722 Plantar fascial fibromatosis: Secondary | ICD-10-CM | POA: Diagnosis not present

## 2012-10-18 DIAGNOSIS — Z961 Presence of intraocular lens: Secondary | ICD-10-CM | POA: Diagnosis not present

## 2012-10-22 DIAGNOSIS — M722 Plantar fascial fibromatosis: Secondary | ICD-10-CM | POA: Diagnosis not present

## 2012-10-25 DIAGNOSIS — N390 Urinary tract infection, site not specified: Secondary | ICD-10-CM | POA: Diagnosis not present

## 2012-10-29 DIAGNOSIS — M722 Plantar fascial fibromatosis: Secondary | ICD-10-CM | POA: Diagnosis not present

## 2012-11-09 DIAGNOSIS — M722 Plantar fascial fibromatosis: Secondary | ICD-10-CM | POA: Diagnosis not present

## 2012-11-16 DIAGNOSIS — M722 Plantar fascial fibromatosis: Secondary | ICD-10-CM | POA: Diagnosis not present

## 2012-11-19 DIAGNOSIS — M722 Plantar fascial fibromatosis: Secondary | ICD-10-CM | POA: Diagnosis not present

## 2012-11-23 DIAGNOSIS — M722 Plantar fascial fibromatosis: Secondary | ICD-10-CM | POA: Diagnosis not present

## 2012-11-26 DIAGNOSIS — M722 Plantar fascial fibromatosis: Secondary | ICD-10-CM | POA: Diagnosis not present

## 2012-11-30 DIAGNOSIS — M722 Plantar fascial fibromatosis: Secondary | ICD-10-CM | POA: Diagnosis not present

## 2012-12-03 DIAGNOSIS — M722 Plantar fascial fibromatosis: Secondary | ICD-10-CM | POA: Diagnosis not present

## 2012-12-07 DIAGNOSIS — M722 Plantar fascial fibromatosis: Secondary | ICD-10-CM | POA: Diagnosis not present

## 2013-01-02 DIAGNOSIS — J019 Acute sinusitis, unspecified: Secondary | ICD-10-CM | POA: Diagnosis not present

## 2013-01-26 DIAGNOSIS — R509 Fever, unspecified: Secondary | ICD-10-CM | POA: Diagnosis not present

## 2013-02-01 ENCOUNTER — Encounter (HOSPITAL_COMMUNITY): Payer: Self-pay | Admitting: Emergency Medicine

## 2013-02-01 ENCOUNTER — Emergency Department (HOSPITAL_COMMUNITY): Payer: Medicare Other

## 2013-02-01 ENCOUNTER — Emergency Department (HOSPITAL_COMMUNITY)
Admission: EM | Admit: 2013-02-01 | Discharge: 2013-02-02 | Disposition: A | Discharge status: Home / Self Care | Payer: Medicare Other | Attending: Emergency Medicine | Admitting: Emergency Medicine

## 2013-02-01 DIAGNOSIS — R059 Cough, unspecified: Secondary | ICD-10-CM | POA: Diagnosis not present

## 2013-02-01 DIAGNOSIS — R197 Diarrhea, unspecified: Secondary | ICD-10-CM | POA: Insufficient documentation

## 2013-02-01 DIAGNOSIS — R05 Cough: Secondary | ICD-10-CM | POA: Diagnosis not present

## 2013-02-01 DIAGNOSIS — Z8669 Personal history of other diseases of the nervous system and sense organs: Secondary | ICD-10-CM | POA: Insufficient documentation

## 2013-02-01 DIAGNOSIS — Z87448 Personal history of other diseases of urinary system: Secondary | ICD-10-CM | POA: Diagnosis not present

## 2013-02-01 DIAGNOSIS — Z860100 Personal history of colon polyps, unspecified: Secondary | ICD-10-CM | POA: Insufficient documentation

## 2013-02-01 DIAGNOSIS — Z79899 Other long term (current) drug therapy: Secondary | ICD-10-CM | POA: Insufficient documentation

## 2013-02-01 DIAGNOSIS — R509 Fever, unspecified: Secondary | ICD-10-CM | POA: Diagnosis not present

## 2013-02-01 DIAGNOSIS — R110 Nausea: Secondary | ICD-10-CM | POA: Diagnosis not present

## 2013-02-01 DIAGNOSIS — IMO0001 Reserved for inherently not codable concepts without codable children: Secondary | ICD-10-CM | POA: Diagnosis not present

## 2013-02-01 DIAGNOSIS — J111 Influenza due to unidentified influenza virus with other respiratory manifestations: Secondary | ICD-10-CM | POA: Diagnosis not present

## 2013-02-01 DIAGNOSIS — Z8541 Personal history of malignant neoplasm of cervix uteri: Secondary | ICD-10-CM | POA: Diagnosis not present

## 2013-02-01 DIAGNOSIS — F411 Generalized anxiety disorder: Secondary | ICD-10-CM | POA: Insufficient documentation

## 2013-02-01 DIAGNOSIS — R51 Headache: Secondary | ICD-10-CM | POA: Insufficient documentation

## 2013-02-01 LAB — POCT I-STAT, CHEM 8
BUN: 7 mg/dL (ref 6–23)
CREATININE: 0.9 mg/dL (ref 0.50–1.10)
Calcium, Ion: 1.1 mmol/L — ABNORMAL LOW (ref 1.13–1.30)
Chloride: 104 mEq/L (ref 96–112)
GLUCOSE: 124 mg/dL — AB (ref 70–99)
HCT: 40 % (ref 36.0–46.0)
HEMOGLOBIN: 13.6 g/dL (ref 12.0–15.0)
Potassium: 3.5 mEq/L — ABNORMAL LOW (ref 3.7–5.3)
Sodium: 138 mEq/L (ref 137–147)
TCO2: 21 mmol/L (ref 0–100)

## 2013-02-01 LAB — CG4 I-STAT (LACTIC ACID): Lactic Acid, Venous: 1.22 mmol/L (ref 0.5–2.2)

## 2013-02-01 MED ORDER — BENZONATATE 100 MG PO CAPS
100.0000 mg | ORAL_CAPSULE | Freq: Once | ORAL | Status: AC
  Administered 2013-02-01: 100 mg via ORAL
  Filled 2013-02-01: qty 1

## 2013-02-01 MED ORDER — ONDANSETRON 4 MG PO TBDP
4.0000 mg | ORAL_TABLET | Freq: Once | ORAL | Status: AC
  Administered 2013-02-01: 4 mg via ORAL
  Filled 2013-02-01: qty 1

## 2013-02-01 MED ORDER — ONDANSETRON HCL 4 MG/2ML IJ SOLN: 4.0000 mg | Freq: Once | INTRAMUSCULAR | Status: DC

## 2013-02-01 MED ORDER — ONDANSETRON 8 MG PO TBDP
8.0000 mg | ORAL_TABLET | Freq: Once | ORAL | Status: AC
  Administered 2013-02-01: 8 mg via ORAL
  Filled 2013-02-01: qty 1

## 2013-02-01 MED ORDER — ACETAMINOPHEN 325 MG PO TABS: 650.0000 mg | ORAL_TABLET | Freq: Four times a day (QID) | ORAL | Status: DC | PRN

## 2013-02-01 MED ORDER — OSELTAMIVIR PHOSPHATE 75 MG PO CAPS
75.0000 mg | ORAL_CAPSULE | Freq: Once | ORAL | Status: AC
  Administered 2013-02-01: 75 mg via ORAL
  Filled 2013-02-01: qty 1

## 2013-02-01 MED ORDER — ACETAMINOPHEN 325 MG PO TABS
650.0000 mg | ORAL_TABLET | Freq: Once | ORAL | Status: AC
  Administered 2013-02-01: 650 mg via ORAL
  Filled 2013-02-01: qty 2

## 2013-02-01 MED ORDER — OSELTAMIVIR PHOSPHATE 75 MG PO CAPS: 75.0000 mg | ORAL_CAPSULE | Freq: Two times a day (BID) | ORAL | Status: DC

## 2013-02-01 MED ORDER — BENZONATATE 100 MG PO CAPS: 100.0000 mg | ORAL_CAPSULE | Freq: Three times a day (TID) | ORAL | Status: DC

## 2013-02-01 MED ORDER — SODIUM CHLORIDE 0.9 % IV BOLUS (SEPSIS)
1000.0000 mL | Freq: Once | INTRAVENOUS | Status: AC
  Administered 2013-02-01: 1000 mL via INTRAVENOUS

## 2013-02-01 NOTE — Discharge Instructions (Signed)

## 2013-02-01 NOTE — ED Provider Notes (Signed)
CSN: [id number]     Arrival date & time 02/01/13  2041 History   First MD Initiated Contact with Patient 02/01/13 2209     Chief Complaint  Patient presents with  . Chills  . Nausea  . Generalized Body Aches  . Diarrhea   (Consider location/radiation/quality/duration/timing/severity/associated sxs/prior Treatment) HPI  73 year old female with hx of fibromyalgia, anxiety, IBS presents with flu-like sxs.  Patient reports last week she has had developing fever as high as 101, headache, nasal congestion, runny nose, sneezing, nonproductive cough, body aches, and now having intermittent nausea and diarrhea. Yesterday had 3 bouts of nonbloody, nonbilious diarrhea, and today she has one bouts of loose stools. She has seen PCP for this complaint last week and was given Z-Pak after the flu test is negative. States husband has similar symptoms which has been ongoing for the past 3 days. Patient also mentioned that she has finished taking a Z-Pak but no symptoms improvement. She felt very weak and tired from a sickness. She is here for further evaluation. She denies any recent travel. She does take Macrodantin daily for chronic interstitial cystitis. She also takes Alleve which provides some relief. She has had her pneumonia shot but does not take flu shot because it makes her sick in the past.   Past Medical History  Diagnosis Date  . Anxiety   . Fibromyalgia   . History of colon polyps     hyperplastic  . History of uterine cancer   . Irritable bowel syndrome   . Chronic interstitial cystitis   . Cataract    Past Surgical History  Procedure Laterality Date  . Abdominal hysterectomy    . Bladder surgery    . Tonsillectomy    . Back surgery      L5  . Cataract surgery ou     Family History  Problem Relation Age of Onset  . Breast cancer Maternal Aunt     cousin  . Colon polyps Sister   . Colon cancer Neg Hx   . Breast cancer Sister   . Lung cancer Father     smoker  . Breast cancer  Cousin    History  Substance Use Topics  . Smoking status: Never Smoker   . Smokeless tobacco: Never Used  . Alcohol Use: Yes     Comment: occasionally   OB History   Grav Para Term Preterm Abortions TAB SAB Ect Mult Living                 Review of Systems  All other systems reviewed and are negative.    Allergies  Review of patient's allergies indicates no known allergies.  Home Medications   Current Outpatient Rx  Name  Route  Sig  Dispense  Refill  . aspirin 325 MG tablet   Oral   Take 325 mg by mouth as needed.           . clonazePAM (KLONOPIN) 0.5 MG tablet   Oral   Take 0.5 mg by mouth as needed.           . nitrofurantoin (MACRODANTIN) 50 MG capsule   Oral   Take 50 mg by mouth daily.           . pantoprazole (PROTONIX) 40 MG tablet      Take 1 tablet by mouth once daily   30 tablet   10    BP 173/65  Pulse 93  Temp(Src) 100.4 F (38 C) (Oral)  Resp 18  Ht 5\' 6"  (1.676 m)  Wt 146 lb (66.225 kg)  BMI 23.58 kg/m2  SpO2 98% Physical Exam  Nursing note and vitals reviewed. Constitutional: She appears well-developed and well-nourished. No distress.  HENT:  Head: Atraumatic.  Right Ear: External ear normal.  Left Ear: External ear normal.  Nose: Nose normal.  Mouth/Throat: Oropharynx is clear and moist. No oropharyngeal exudate.  Eyes: Conjunctivae are normal.  Neck: Normal range of motion. Neck supple.  Cardiovascular: Normal rate and regular rhythm.   Pulmonary/Chest: Effort normal and breath sounds normal. She has no wheezes. She has no rales.  Abdominal: Soft. There is no tenderness.  Lymphadenopathy:    She has no cervical adenopathy.  Neurological: She is alert.  Skin: No rash noted.  Psychiatric: She has a normal mood and affect.    ED Course  Procedures (including critical care time)  10:47 PM Patient presents with flulike symptoms. She does have a low-grade temp of 100.4, tylenol given. She , does not toxic, and has no  significant comorbidities. Plan to obtain chest x-ray, check basic labs,  provide cough medication and we'll continue to monitor.    11:45 PM Chest x-ray is negative for pneumonia. She has no lactic acid, her electrolytes are reassuring.  Dr. Mingo Amber has evaluate pt and recommend tamiflu as treatment.   Labs Review Labs Reviewed  POCT I-STAT, CHEM 8 - Abnormal; Notable for the following:    Potassium 3.5 (*)    Glucose, Bld 124 (*)    Calcium, Ion 1.10 (*)    All other components within normal limits  CBC WITH DIFFERENTIAL  CG4 I-STAT (LACTIC ACID)   Imaging Review Dg Chest 2 View  02/01/2013   CLINICAL DATA:  Cough and fever  EXAM: CHEST  2 VIEW  COMPARISON:  None.  FINDINGS: The heart size and mediastinal contours are within normal limits. Both lungs are clear. Apparent blunting of the right posterior costophrenic sulcus is likely artifactual. Thoracic spondylosis and left glenohumeral osteoarthritis.  IMPRESSION: Negative for pneumonia.   Electronically Signed   By: Jorje Guild M.D.   On: 02/01/2013 23:07    EKG Interpretation   None       MDM   1. Influenza    BP 137/58  Pulse 72  Temp(Src) 98.5 F (36.9 C) (Oral)  Resp 15  Ht 5\' 6"  (1.676 m)  Wt 146 lb (66.225 kg)  BMI 23.58 kg/m2  SpO2 97%  I have reviewed nursing notes and vital signs. I personally reviewed the imaging tests through PACS system  I reviewed available ER/hospitalization records thought the EMR     Domenic Moras, PA-C 02/02/13 0028

## 2013-02-01 NOTE — ED Notes (Signed)
Pt from home c/o fever and body aches since Wednesday. Pt seen at Urgent care and diagnosed with a sinus infection. Pt was negative for flu. She is taking z Pak symptoms no better.

## 2013-02-02 NOTE — ED Provider Notes (Signed)
Medical screening examination/treatment/procedure(s) were conducted as a shared visit with non-physician practitioner(s) and myself.  I personally evaluated the patient during the encounter.  EKG Interpretation   None       Patient here with 1 week of URI symptoms, fever, nausea, diarrhea. Well-appearing, easily ambulatory. Patient has no PNA. Lungs clear, vitals stable. Stable for discharge.  Osvaldo Shipper, MD 02/02/13 [phone]

## 2013-03-18 DIAGNOSIS — J329 Chronic sinusitis, unspecified: Secondary | ICD-10-CM | POA: Diagnosis not present

## 2013-03-25 DIAGNOSIS — M722 Plantar fascial fibromatosis: Secondary | ICD-10-CM | POA: Diagnosis not present

## 2013-05-04 ENCOUNTER — Telehealth: Payer: Self-pay | Admitting: Internal Medicine

## 2013-05-04 NOTE — Telephone Encounter (Signed)
Patient reports that protonix is not helping.  She is having nausea,  daily diarrhea 3-4 times a day, soreness in abdominal area, and unable to eat. She is on macrodantin for bladder issues, but no other sick contacts, recent travel, or antibiotics.   Symptoms for the last 2 weeks are worsening.  She will come in and see Tye Savoy RNP on Friday 05/06/13 2:00

## 2013-05-04 NOTE — Telephone Encounter (Signed)
Please call in Zofran 8 mg, #20 1 po q8 hrs, also she has Levsin SL.125 mg tid ac, callin if she ran out. Continue Protonix 40 mg qd. Hx of IBS/ICystitis

## 2013-05-05 ENCOUNTER — Encounter: Payer: Self-pay | Admitting: *Deleted

## 2013-05-05 MED ORDER — HYOSCYAMINE SULFATE 0.125 MG SL SUBL: 0.1250 mg | SUBLINGUAL_TABLET | Freq: Three times a day (TID) | SUBLINGUAL | Status: DC

## 2013-05-05 MED ORDER — ONDANSETRON HCL 8 MG PO TABS: 8.0000 mg | ORAL_TABLET | Freq: Three times a day (TID) | ORAL | Status: DC | PRN

## 2013-05-05 NOTE — Telephone Encounter (Signed)
Patient aware, she will keep appt for tomorrow

## 2013-05-06 ENCOUNTER — Encounter: Payer: Self-pay | Admitting: Nurse Practitioner

## 2013-05-06 ENCOUNTER — Ambulatory Visit (INDEPENDENT_AMBULATORY_CARE_PROVIDER_SITE_OTHER): Payer: Medicare Other | Admitting: Nurse Practitioner

## 2013-05-06 VITALS — BP 152/78 | HR 84 | Ht 66.0 in | Wt 143.0 lb

## 2013-05-06 DIAGNOSIS — K589 Irritable bowel syndrome without diarrhea: Secondary | ICD-10-CM | POA: Diagnosis not present

## 2013-05-06 NOTE — Progress Notes (Signed)
     History of Present Illness:  Patient is a 73 year old female known to Dr. Olevia Perches. She has irritable bowel syndrome and has also been evaluated for dyspepsia and nausea.  Patient has multiple drug sensitivities, especially antibiotics.  Her last colonoscopy in April 2012 was normal  Patient comes in today for evaluation of recurrent nausea and diarrhea. Her bladder was removed 20 years ago and since then she has experienced periods of nausea and diarrhea. In between episodes she is fine. This current episode is lasting longer than usual and nausea hasn't responded to Protonix this time. Otherwise, nothing is different than previous episodes. No recent antibiotics (except for chronic macrodantin) or sick contacts. We recently called in [place] and it seems to be "settling things"   Current Medications, Allergies, Past Medical History, Past Surgical History, Family History and Social History were reviewed in [employer] record.  Physical Exam: General: Pleasant, well developed , white female in no acute distress Head: Normocephalic and atraumatic Eyes:  sclerae anicteric, conjunctiva pink  Ears: Normal auditory acuity Lungs: Clear throughout to auscultation Heart: Regular rate and rhythm Abdomen: Soft, non distended, non-tender. No masses, no hepatomegaly. Normal bowel sounds Musculoskeletal: Symmetrical with no gross deformities  Extremities: No edema  Neurological: Alert oriented x 4, grossly nonfocal Psychological:  Alert and cooperative. Normal mood and affect  Assessment and Recommendations:  73 year old female with chronic, episodic nausea and diarrhea. We recently called in levsin to pharmacy and she is feeling better after starting it. At this point patient will complete prescription of Levsin and call for recurrent symptoms.

## 2013-05-06 NOTE — Patient Instructions (Signed)
Finish Levsin   Continue your Zofran and Protonix  Call in the next couple of weeks if you are not better.

## 2013-05-08 ENCOUNTER — Encounter: Payer: Self-pay | Admitting: Nurse Practitioner

## 2013-05-08 NOTE — Progress Notes (Signed)
Reviewed and agree.

## 2013-05-25 DIAGNOSIS — N390 Urinary tract infection, site not specified: Secondary | ICD-10-CM | POA: Diagnosis not present

## 2013-05-25 DIAGNOSIS — F411 Generalized anxiety disorder: Secondary | ICD-10-CM | POA: Diagnosis not present

## 2013-05-25 DIAGNOSIS — R197 Diarrhea, unspecified: Secondary | ICD-10-CM | POA: Insufficient documentation

## 2013-05-25 DIAGNOSIS — N3020 Other chronic cystitis without hematuria: Secondary | ICD-10-CM | POA: Diagnosis not present

## 2013-05-26 ENCOUNTER — Other Ambulatory Visit: Payer: Self-pay | Admitting: Urology

## 2013-05-26 DIAGNOSIS — N1330 Unspecified hydronephrosis: Secondary | ICD-10-CM

## 2013-05-26 DIAGNOSIS — N3020 Other chronic cystitis without hematuria: Secondary | ICD-10-CM | POA: Diagnosis not present

## 2013-06-01 ENCOUNTER — Ambulatory Visit
Admission: RE | Admit: 2013-06-01 | Discharge: 2013-06-01 | Disposition: A | Discharge status: Home / Self Care | Payer: Medicare Other | Source: Ambulatory Visit | Attending: Urology | Admitting: Urology

## 2013-06-01 DIAGNOSIS — N1330 Unspecified hydronephrosis: Secondary | ICD-10-CM

## 2013-06-01 DIAGNOSIS — N281 Cyst of kidney, acquired: Secondary | ICD-10-CM | POA: Diagnosis not present

## 2013-08-19 DIAGNOSIS — Z803 Family history of malignant neoplasm of breast: Secondary | ICD-10-CM | POA: Diagnosis not present

## 2013-08-19 DIAGNOSIS — Z1231 Encounter for screening mammogram for malignant neoplasm of breast: Secondary | ICD-10-CM | POA: Diagnosis not present

## 2013-08-22 ENCOUNTER — Other Ambulatory Visit: Payer: Self-pay | Admitting: Internal Medicine

## 2013-11-09 ENCOUNTER — Other Ambulatory Visit: Payer: Self-pay | Admitting: Internal Medicine

## 2013-11-14 ENCOUNTER — Telehealth: Payer: Self-pay | Admitting: Internal Medicine

## 2013-11-14 NOTE — Telephone Encounter (Signed)
Patient states she has had nausea and diarrhea off and on since she was here in the spring. Reports latest episode for the last week. She is taking Pepto bismol which seems to help both a little. States Levsin does not help. Offered OV with extender but she prefers Dr. Olevia Perches. Scheduled on 11/25/13 at 2:00 PM with Dr. Olevia Perches.

## 2013-11-25 ENCOUNTER — Ambulatory Visit (INDEPENDENT_AMBULATORY_CARE_PROVIDER_SITE_OTHER): Payer: Medicare Other | Admitting: Internal Medicine

## 2013-11-25 ENCOUNTER — Encounter: Payer: Self-pay | Admitting: Internal Medicine

## 2013-11-25 VITALS — BP 134/70 | HR 80 | Ht 65.0 in | Wt 139.1 lb

## 2013-11-25 DIAGNOSIS — R110 Nausea: Secondary | ICD-10-CM | POA: Diagnosis not present

## 2013-11-25 DIAGNOSIS — R1012 Left upper quadrant pain: Secondary | ICD-10-CM

## 2013-11-25 DIAGNOSIS — R197 Diarrhea, unspecified: Secondary | ICD-10-CM

## 2013-11-25 MED ORDER — ONDANSETRON HCL 8 MG PO TABS: 8.0000 mg | ORAL_TABLET | Freq: Three times a day (TID) | ORAL | Status: DC | PRN

## 2013-11-25 NOTE — Progress Notes (Signed)
[name] 1940/11/08 [id number]  Note: This dictation was prepared with Dragon digital system. Any transcriptional errors that result from this procedure are unintentional.   History of Present Illness:  This is a 73 year old white female with diarrhea predominant irritable bowel syndrome and history of severe interstitial cystitis followed by Dr.Evans. She had a complete cystectomy and neobladder formation. She has been having a flareup of nausea, dyspepsia and upper abdominal discomfort localized to the left costal margin. She saw P.Guenther NP in our office in April 2015 and was put on Zofran, levsin sublingually and Protonix. Patient has a positive family history of gallbladder disease in her son who had  A lap  cholecystectomy. We did an upper endoscopy in April 2007 for eval. Of  nausea but it showed no significant findings. Her last colonoscopy in 2012 was normal. Her weight has been stable. She denies rectal bleeding and she denies dysphagia or heartburn.     Past Medical History  Diagnosis Date  . Anxiety   . Fibromyalgia   . History of colon polyps     hyperplastic  . History of uterine cancer   . Irritable bowel syndrome   . Chronic interstitial cystitis   . Cataract     Past Surgical History  Procedure Laterality Date  . Abdominal hysterectomy    . Bladder surgery    . Tonsillectomy    . Back surgery      L5  . Cataract extraction    . Colonoscopy  2012    normal   . Esophagogastroduodenoscopy  2007    normal    No Known Allergies  Family history and social history have been reviewed.  Review of Systems: Upper abdominal pain. Nausea. No vomiting  The remainder of the 10 point ROS is negative except as outlined in the H&P  Physical Exam: General Appearance Well developed, in no distress Eyes  Non icteric  HEENT  Non traumatic, normocephalic  Mouth No lesion, tongue papillated, no cheilosis Neck Supple without adenopathy, thyroid not enlarged, no  carotid bruits, no JVD Lungs Clear to auscultation bilaterally COR Normal S1, normal S2, regular rhythm, no murmur, quiet precordium Abdomen Diffusely tender in all quadrants but more so in the left upper quadrant and suprapubic area. Bowel sounds are normoactive. There is no tympany.  Rectal Not done  Extremities  No pedal edema Skin No lesions Neurological Alert and oriented x 3 Psychological Normal mood and affect  Assessment and Plan:   Problem #71 73 year old white female with irritable bowel syndrome and upper abdominal pain associated with dyspepsia and nausea suggestive of gastritis or gastropathy. Gallbladder dysfunction is also a possibility. We will proceed with an upper abdominal ultrasound as well as with upper endoscopy. She will continue Protonix 40 mg daily and we will refill Zofran 8 mg to take every 8 hours for her to take when necessary. She also may use Levsin sublingually 0.125 mg as needed for cramping and abdominal pain.  Problem #2 Colorectal screening. She is up-to-date on her colonoscopy. Her last exam was in April 2012.    Delfin Edis 11/25/2013

## 2013-11-25 NOTE — Patient Instructions (Addendum)
You have been scheduled for an endoscopy. Please follow written instructions given to you at your visit today. If you use inhalers (even only as needed), please bring them with you on the day of your procedure. Your physician has requested that you go to www.startemmi.com and enter the access code given to you at your visit today. This web site gives a general overview about your procedure. However, you should still follow specific instructions given to you by our office regarding your preparation for the procedure.  We have sent the following medications to your pharmacy for you to pick up at your convenience: Zofran  You have been scheduled for an abdominal ultrasound at [facility] (1st floor of hospital) on 12/01/13 at 9:00 am. Please arrive 15 minutes prior to your appointment for registration. Make certain not to have anything to eat or drink 6 hours prior to your appointment. Should you need to reschedule your appointment, please contact radiology at [phone]. This test typically takes about 30 minutes to perform.  CC:Dr Donnie Coffin, Dr Alona Bene, urology

## 2013-11-29 ENCOUNTER — Encounter: Payer: Self-pay | Admitting: Internal Medicine

## 2013-11-29 ENCOUNTER — Ambulatory Visit (AMBULATORY_SURGERY_CENTER): Payer: Medicare Other | Admitting: Internal Medicine

## 2013-11-29 VITALS — BP 139/76 | HR 55 | Temp 97.1°F | Resp 19 | Ht 65.0 in | Wt 139.0 lb

## 2013-11-29 DIAGNOSIS — K589 Irritable bowel syndrome without diarrhea: Secondary | ICD-10-CM | POA: Diagnosis not present

## 2013-11-29 DIAGNOSIS — M797 Fibromyalgia: Secondary | ICD-10-CM | POA: Diagnosis not present

## 2013-11-29 DIAGNOSIS — R110 Nausea: Secondary | ICD-10-CM

## 2013-11-29 DIAGNOSIS — R197 Diarrhea, unspecified: Secondary | ICD-10-CM | POA: Diagnosis not present

## 2013-11-29 DIAGNOSIS — Z86010 Personal history of colonic polyps: Secondary | ICD-10-CM | POA: Diagnosis not present

## 2013-11-29 MED ORDER — SODIUM CHLORIDE 0.9 % IV SOLN: 500.0000 mL | INTRAVENOUS | Status: DC

## 2013-11-29 NOTE — Progress Notes (Signed)
Called to room to assist during endoscopic procedure.  Patient ID and intended procedure confirmed with present staff. Received instructions for my participation in the procedure from the performing physician.  

## 2013-11-29 NOTE — Progress Notes (Signed)
Stable to RR 

## 2013-11-29 NOTE — Op Note (Signed)
[place]  [address] [place], [address]   ENDOSCOPY PROCEDURE REPORT  PATIENT: [name], [name]  MR#: [id number] BIRTHDATE: October 14, 1940 , 89  yrs. old GENDER: female ENDOSCOPIST: Lafayette Dragon, MD REFERRED BY:  Donnie Coffin, M.D. PROCEDURE DATE:  11/29/2013 PROCEDURE:  EGD w/ biopsy ASA CLASS:     Class II INDICATIONS:  recent onset of nausea.  Dyspepsia.  Last upper endoscopy in April 2007 showed mild gastritis.  Patient has a history of irritable bowel syndrome and severe interstitial cystitis. MEDICATIONS: Monitored anesthesia care and Propofol 100 mg IV TOPICAL ANESTHETIC: none  DESCRIPTION OF PROCEDURE: After the risks benefits and alternatives of the procedure were thoroughly explained, informed consent was obtained.  The LB [id number] endoscope was introduced through the mouth and advanced to the second portion of the duodenum , Without limitations.  The instrument was slowly withdrawn as the mucosa was fully examined.    Esophagus: proximal, mid and distal esophageal mucosa was normal. Squamocolumnar junction was irregular. There was a wide tongue of gastric mucosa extending into distal esophagus. Biopsies were obtained to rule out Barrett's  Stomach: rugal folds were diminished. Gastric antrum was normal. Biopsies were obtained to rule out H. pylori and atrophic gastritis. Retroflexion of the endoscope revealed normal fundus and cardia. Gastric antrum  was normal Duodenum: duodenal bulb and descending duodenum was normal. Small bowel biopsies were obtained to rule out villous atrophy[ The scope was then withdrawn from the patient and the procedure completed.  COMPLICATIONS: There were no immediate complications.  ENDOSCOPIC IMPRESSION: 1. irregular squamocolumnar junction. Status post biopsies 2. Mild gastritis gastric antrum. Status post biopsies 3. Small bowel biopsies to rule out villous atrophy  RECOMMENDATIONS: 1.  Await  pathology results 2.  Ccontinue Zofran when necessary Continue PPI  REPEAT EXAM: for EGD pending biopsy results.  eSigned:  Lafayette Dragon, MD 11/29/2013 11:18 AM    CC:  PATIENT NAME:  [name], [name] MR#: [id number]

## 2013-11-29 NOTE — Patient Instructions (Addendum)
YOU HAD AN ENDOSCOPIC PROCEDURE TODAY AT [facility]: Refer to the procedure report that was given to you for any specific questions about what was found during the examination.  If the procedure report does not answer your questions, please call your gastroenterologist to clarify.  If you requested that your care partner not be given the details of your procedure findings, then the procedure report has been included in a sealed envelope for you to review at your convenience later.  YOU SHOULD EXPECT: Some feelings of bloating in the abdomen. Passage of more gas than usual.  Walking can help get rid of the air that was put into your GI tract during the procedure and reduce the bloating.  DIET: Your first meal following the procedure should be a light meal and then it is ok to progress to your normal diet.  A half-sandwich or bowl of soup is an example of a good first meal.  Heavy or fried foods are harder to digest and may make you feel nauseous or bloated.  Likewise meals heavy in dairy and vegetables can cause extra gas to form and this can also increase the bloating.  Drink plenty of fluids but you should avoid alcoholic beverages for 24 hours.  ACTIVITY: Your care partner should take you home directly after the procedure.  You should plan to take it easy, moving slowly for the rest of the day.  You can resume normal activity the day after the procedure however you should NOT DRIVE or use heavy machinery for 24 hours (because of the sedation medicines used during the test).    SYMPTOMS TO REPORT IMMEDIATELY: A gastroenterologist can be reached at any hour.  During normal business hours, 8:30 AM to 5:00 PM Monday through Friday, call [phone].  After hours and on weekends, please call the GI answering service at [phone] who will take a message and have the physician on call contact you.   Following upper endoscopy (EGD)  Vomiting of blood or coffee ground material  New  chest pain or pain under the shoulder blades  Painful or persistently difficult swallowing  New shortness of breath  Fever of 100F or higher  Black, tarry-looking stools  FOLLOW UP: If any biopsies were taken you will be contacted by phone or by letter within the next 1-3 weeks.  Call your gastroenterologist if you have not heard about the biopsies in 3 weeks.  Our staff will call the home number listed on your records the next business day following your procedure to check on you and address any questions or concerns that you may have at that time regarding the information given to you following your procedure. This is a courtesy call and so if there is no answer at the home number and we have not heard from you through the emergency physician on call, we will assume that you have returned to your regular daily activities without incident.  SIGNATURES/CONFIDENTIALITY: You and/or your care partner have signed paperwork which will be entered into your electronic medical record.  These signatures attest to the fact that that the information above on your After Visit Summary has been reviewed and is understood.  Full responsibility of the confidentiality of this discharge information lies with you and/or your care-partner.  Continue your zofran and your PPI per Dr. Olevia Perches.

## 2013-11-30 ENCOUNTER — Telehealth: Payer: Self-pay

## 2013-11-30 NOTE — Telephone Encounter (Signed)
  Follow up Call-  Call back number 11/29/2013  Post procedure Call Back phone  # [phone]  Permission to leave phone message Yes     Patient questions:  Do you have a fever, pain , or abdominal swelling? No. Pain Score  0 *  Have you tolerated food without any problems? Yes.    Have you been able to return to your normal activities? Yes.    Do you have any questions about your discharge instructions: Diet   No. Medications  No. Follow up visit  No.  Do you have questions or concerns about your Care? No.  Actions: * If pain score is 4 or above: No action needed, pain <4.

## 2013-12-01 ENCOUNTER — Ambulatory Visit (HOSPITAL_COMMUNITY)
Admission: RE | Admit: 2013-12-01 | Discharge: 2013-12-01 | Disposition: A | Discharge status: Home / Self Care | Payer: Medicare Other | Source: Ambulatory Visit | Attending: Internal Medicine | Admitting: Internal Medicine

## 2013-12-01 DIAGNOSIS — R110 Nausea: Secondary | ICD-10-CM | POA: Insufficient documentation

## 2013-12-01 DIAGNOSIS — Z0389 Encounter for observation for other suspected diseases and conditions ruled out: Secondary | ICD-10-CM | POA: Diagnosis not present

## 2013-12-01 DIAGNOSIS — Z8542 Personal history of malignant neoplasm of other parts of uterus: Secondary | ICD-10-CM | POA: Diagnosis not present

## 2013-12-01 DIAGNOSIS — K589 Irritable bowel syndrome without diarrhea: Secondary | ICD-10-CM | POA: Insufficient documentation

## 2013-12-07 ENCOUNTER — Encounter: Payer: Self-pay | Admitting: Internal Medicine

## 2013-12-09 DIAGNOSIS — J019 Acute sinusitis, unspecified: Secondary | ICD-10-CM | POA: Diagnosis not present

## 2013-12-22 ENCOUNTER — Emergency Department (HOSPITAL_COMMUNITY)
Admission: EM | Admit: 2013-12-22 | Discharge: 2013-12-22 | Disposition: A | Discharge status: Home / Self Care | Payer: Medicare Other | Attending: Emergency Medicine | Admitting: Emergency Medicine

## 2013-12-22 ENCOUNTER — Encounter (HOSPITAL_COMMUNITY): Payer: Self-pay | Admitting: Emergency Medicine

## 2013-12-22 ENCOUNTER — Emergency Department (HOSPITAL_COMMUNITY): Payer: Medicare Other

## 2013-12-22 DIAGNOSIS — M7989 Other specified soft tissue disorders: Secondary | ICD-10-CM | POA: Diagnosis not present

## 2013-12-22 DIAGNOSIS — Z8719 Personal history of other diseases of the digestive system: Secondary | ICD-10-CM | POA: Insufficient documentation

## 2013-12-22 DIAGNOSIS — Z792 Long term (current) use of antibiotics: Secondary | ICD-10-CM | POA: Insufficient documentation

## 2013-12-22 DIAGNOSIS — Z8669 Personal history of other diseases of the nervous system and sense organs: Secondary | ICD-10-CM | POA: Insufficient documentation

## 2013-12-22 DIAGNOSIS — S99911A Unspecified injury of right ankle, initial encounter: Secondary | ICD-10-CM | POA: Diagnosis present

## 2013-12-22 DIAGNOSIS — W010XXA Fall on same level from slipping, tripping and stumbling without subsequent striking against object, initial encounter: Secondary | ICD-10-CM | POA: Insufficient documentation

## 2013-12-22 DIAGNOSIS — Y998 Other external cause status: Secondary | ICD-10-CM | POA: Insufficient documentation

## 2013-12-22 DIAGNOSIS — Z86010 Personal history of colonic polyps: Secondary | ICD-10-CM | POA: Insufficient documentation

## 2013-12-22 DIAGNOSIS — Z87448 Personal history of other diseases of urinary system: Secondary | ICD-10-CM | POA: Insufficient documentation

## 2013-12-22 DIAGNOSIS — S82401A Unspecified fracture of shaft of right fibula, initial encounter for closed fracture: Secondary | ICD-10-CM | POA: Diagnosis not present

## 2013-12-22 DIAGNOSIS — Y92009 Unspecified place in unspecified non-institutional (private) residence as the place of occurrence of the external cause: Secondary | ICD-10-CM | POA: Diagnosis not present

## 2013-12-22 DIAGNOSIS — Z8542 Personal history of malignant neoplasm of other parts of uterus: Secondary | ICD-10-CM | POA: Diagnosis not present

## 2013-12-22 DIAGNOSIS — M1711 Unilateral primary osteoarthritis, right knee: Secondary | ICD-10-CM | POA: Diagnosis not present

## 2013-12-22 DIAGNOSIS — F419 Anxiety disorder, unspecified: Secondary | ICD-10-CM | POA: Insufficient documentation

## 2013-12-22 DIAGNOSIS — Z79899 Other long term (current) drug therapy: Secondary | ICD-10-CM | POA: Insufficient documentation

## 2013-12-22 DIAGNOSIS — M797 Fibromyalgia: Secondary | ICD-10-CM | POA: Insufficient documentation

## 2013-12-22 DIAGNOSIS — S82831A Other fracture of upper and lower end of right fibula, initial encounter for closed fracture: Secondary | ICD-10-CM | POA: Diagnosis not present

## 2013-12-22 DIAGNOSIS — S8991XA Unspecified injury of right lower leg, initial encounter: Secondary | ICD-10-CM | POA: Diagnosis not present

## 2013-12-22 DIAGNOSIS — Y9389 Activity, other specified: Secondary | ICD-10-CM | POA: Diagnosis not present

## 2013-12-22 MED ORDER — OXYCODONE-ACETAMINOPHEN 5-325 MG PO TABS: 1.0000 | ORAL_TABLET | Freq: Four times a day (QID) | ORAL | Status: DC | PRN

## 2013-12-22 MED ORDER — PROMETHAZINE HCL 25 MG PO TABS
12.5000 mg | ORAL_TABLET | Freq: Once | ORAL | Status: AC
  Administered 2013-12-22: 12.5 mg via ORAL
  Filled 2013-12-22: qty 1

## 2013-12-22 MED ORDER — OXYCODONE-ACETAMINOPHEN 5-325 MG PO TABS
2.0000 | ORAL_TABLET | Freq: Once | ORAL | Status: AC
  Administered 2013-12-22: 2 via ORAL
  Filled 2013-12-22: qty 2

## 2013-12-22 NOTE — ED Provider Notes (Signed)
CSN: [id number]     Arrival date & time 12/22/13  1754 History   First MD Initiated Contact with Patient 12/22/13 1756     Chief Complaint  Patient presents with  . Ankle Injury     (Consider location/radiation/quality/duration/timing/severity/associated sxs/prior Treatment) HPI Comments: Patient is a 73 year old female with history of fibromyalgia, IBS, chronic interstitial cystitis, and anxiety who presents to the ED today after a fall. She was in her home when she tripped over her slipper. She inverted and hit her foot on a small step up. She denies hitting her head or any additional injuries. She has severe throbbing pain in the ankle with radiation up and down her leg. Movement makes her pain worse. She has not had anything to make her pain better. Her orthopedic doctor is Dr. Gladstone Lighter with [facility].   The history is provided by the patient. No language interpreter was used.    Past Medical History  Diagnosis Date  . Anxiety   . Fibromyalgia   . History of colon polyps     hyperplastic  . History of uterine cancer   . Irritable bowel syndrome   . Chronic interstitial cystitis   . Cataract    Past Surgical History  Procedure Laterality Date  . Abdominal hysterectomy    . Bladder surgery    . Tonsillectomy    . Back surgery      L5  . Cataract extraction    . Colonoscopy  2012    normal   . Esophagogastroduodenoscopy  2007    normal   Family History  Problem Relation Age of Onset  . Breast cancer Maternal Aunt     cousin  . Colon polyps Sister   . Colon cancer Neg Hx   . Esophageal cancer Neg Hx   . Stomach cancer Neg Hx   . Breast cancer Sister   . Lung cancer Father     smoker  . Breast cancer Cousin    History  Substance Use Topics  . Smoking status: Never Smoker   . Smokeless tobacco: Never Used  . Alcohol Use: Yes     Comment: occasionally   OB History    No data available     Review of Systems  Constitutional: Negative for fever  and chills.  Respiratory: Negative for shortness of breath.   Cardiovascular: Negative for chest pain.  Gastrointestinal: Negative for nausea, vomiting and abdominal pain.  Musculoskeletal: Positive for myalgias, joint swelling, arthralgias and gait problem.  Skin: Positive for color change.  Neurological: Negative for weakness, light-headedness, numbness and headaches.  All other systems reviewed and are negative.     Allergies  Review of patient's allergies indicates no known allergies.  Home Medications   Prior to Admission medications   Medication Sig Start Date End Date Taking? Authorizing Provider  ampicillin (PRINCIPEN) 500 MG capsule Take 500 mg by mouth 2 (two) times daily.   Yes Historical Provider, MD  clonazePAM (KLONOPIN) 0.5 MG tablet Take 0.5 mg by mouth as needed for anxiety (anxiety).    Yes Historical Provider, MD  nitrofurantoin (MACRODANTIN) 50 MG capsule Take 50 mg by mouth daily.     Yes Historical Provider, MD  pantoprazole (PROTONIX) 40 MG tablet TAKE 1 TABLET BY MOUTH ONCE DAILY   Yes Lafayette Dragon, MD  Pseudoephedrine HCl (SUDAFED PO) Take 1 tablet by mouth daily as needed (pain).   Yes Historical Provider, MD  hyoscyamine (LEVSIN SL) 0.125 MG SL tablet PLACE  1 TABLET UNDER THE TONGUE 3 TIMES A DAY Patient not taking: Reported on 12/22/2013 11/10/13   Lafayette Dragon, MD  ondansetron (ZOFRAN) 8 MG tablet Take 1 tablet (8 mg total) by mouth every 8 (eight) hours as needed for nausea or vomiting. 11/25/13   Lafayette Dragon, MD   BP 140/50 mmHg  Pulse 80  Temp(Src) 98 F (36.7 C) (Oral)  Resp 18  SpO2 98% Physical Exam  Constitutional: She is oriented to person, place, and time. She appears well-developed and well-nourished. No distress.  HENT:  Head: Normocephalic and atraumatic.  Right Ear: External ear normal.  Left Ear: External ear normal.  Nose: Nose normal.  Mouth/Throat: Oropharynx is clear and moist.  Eyes: Conjunctivae are normal.  Neck: Normal  range of motion.  Cardiovascular: Normal rate, regular rhythm, normal heart sounds, intact distal pulses and normal pulses.   Pulses:      Dorsalis pedis pulses are 2+ on the right side.       Posterior tibial pulses are 2+ on the right side.  Pulmonary/Chest: Effort normal and breath sounds normal. No stridor. No respiratory distress. She has no wheezes. She has no rales.  Abdominal: Soft. She exhibits no distension.  Musculoskeletal:       Right ankle: She exhibits decreased range of motion, swelling and ecchymosis. She exhibits no deformity and normal pulse. Tenderness.  Bruising and swelling to right ankle. Tender to palpation diffusely over ankle. No deformity Sensation intact. Compartments soft.   Neurological: She is alert and oriented to person, place, and time. She has normal strength.  Skin: Skin is warm and dry. She is not diaphoretic. No erythema.  Psychiatric: She has a normal mood and affect. Her behavior is normal.  Nursing note and vitals reviewed.   ED Course  Procedures (including critical care time) Labs Review Labs Reviewed - No data to display  Imaging Review Dg Tibia/fibula Right  12/22/2013   CLINICAL DATA:  Status post fall.  EXAM: RIGHT TIBIA AND FIBULA - 2 VIEW  COMPARISON:  None.  FINDINGS: There is no evidence of fracture or other focal bone lesions. Advanced degenerative changes noted within the knee. Soft tissues are unremarkable.  IMPRESSION: No acute findings.   Electronically Signed   By: Kerby Moors M.D.   On: 12/22/2013 19:02   Dg Ankle Complete Right  12/22/2013   CLINICAL DATA:  Patient tripped and fell. Bruising and swelling laterally  EXAM: RIGHT ANKLE - COMPLETE 3+ VIEW  COMPARISON:  None.  FINDINGS: Frontal, oblique, and lateral views were obtained. There is marked swelling laterally. There is a nondisplaced fracture of the distal fibular metaphysis. There is a sclerotic area in the mid talus which is concerning for an impaction type injury in  the talus. There is loss of anatomic detail in the anterior subtalar joint region on the lateral view, concerning for a potential anterior calcaneal fracture is well. Ankle mortise appears grossly intact. No appreciable effusion.  IMPRESSION: Marked soft tissue swelling laterally. Linear fracture distal fibular metaphysis. Findings concerning for impaction type injury in the talus as well as potential depression type injury in the anterior calcaneus based on loss of anterior subtalar joint detail on the lateral view. These findings may warrant hindfoot CT to further assess. Ankle mortise appears grossly intact.   Electronically Signed   By: Lowella Grip M.D.   On: 12/22/2013 19:00     EKG Interpretation None      MDM   Final diagnoses:  Closed fibular fracture, right, initial encounter    Patient presents to ED for evaluation of closed fibular fracture of right ankle. Some question of impaction type injury to the talus as well as depression type injury type injury in the anterior calcaneous based on loss of anterior subtalar joint detail on the lateral view. Discussed this with on call physician for [facility] who believes this would be unlikely and she can follow up with them in the office early next week. Will place in cam walker and give crutches. Discussed reasons to return to ED immediately. Vital signs stable for discharge. Dr. Regenia Skeeter evaluated patient and agrees with plan. Patient / Family / Caregiver informed of clinical course, understand medical decision-making process, and agree with plan.    Elwyn Lade, PA-C 12/23/13 [place], MD 12/24/13 [phone]

## 2013-12-22 NOTE — ED Notes (Signed)
Pt from home c/o R ankle pain after tripping over her bedroom shoes that she was wearing approx 30 minutes PTA. Pt has good pulses, cap refill and can wiggle toes slightly. Pt denies hitting head or other injuries. Pt has swelling to medial and lateral R ankle, no deformity noted. Pt is A&O and in NAD

## 2013-12-22 NOTE — Discharge Instructions (Signed)
Fibular Fracture with Rehab The fibula is the smaller of the two lower leg bones and is vulnerable to breaks (fracture). Fibular fractures may go fully through the bone (complete) or partially (incomplete). The bone fragments are rarely out of alignment (displaced fracture). Fibula fractures may occur anywhere along the bone. However, this document only discusses fractures that do not involve a leg joint. Fibular fractures are not often a severe injury because the bone supports only about 17% of the body weight. SYMPTOMS   Moderate to severe pain in the lower leg.  Tenderness and swelling in the leg or calf.  Bleeding and/or bruising (contusion) in the leg.  Inability to bear weight on the injured extremity.  Visible deformity, if the fracture is displaced.  Numbness and coldness in the leg and foot, beyond the fracture site, if blood supply is impaired. CAUSES  Fractures occur when a force is placed on the bone that is greater than it can withstand. Common causes of fibular fracture include:  Direct hit (trauma) (i.e., hockey or lacrosse check to the lower leg).  Stress fracture (weakening of the bone from repeated stress).  Indirect injury, caused by twisting, turning quickly, or violent muscle contraction. RISK INCREASES WITH:  Contact sports (i.e., football, soccer, lacrosse, hockey).  Sports that can cause twisted ankle injury (i.e., skiing, basketball).  Bony abnormalities (i.e., osteoporosis or bone tumors).  Metabolism disorders, hormone problems, and nutrition deficiency and disorders (i.e., anorexia and bulimia).  Poor strength and flexibility. PREVENTION   Warm up and stretch properly before activity.  Maintain physical fitness:  Strength, flexibility, and endurance.  Cardiovascular fitness.  Wear properly fitted and padded protective equipment (i.e., shin guards for soccer). PROGNOSIS  If treated properly, fibular fractures usually heal in 4 to 6 weeks.    RELATED COMPLICATIONS   Failure of bone to heal (nonunion).  Bone heals in a poor position (malunion).  Increased pressure inside the leg (compartment syndrome) due to injury that disrupts the blood supply to the leg and foot and injures the nerves and muscles (uncommon).  Shortening of the injured bones.  Hindrance of normal bone growth in children.  Risks of surgery: infection, bleeding, injury to nerves (numbness, weakness, paralysis), need for further surgery.  Longer healing time if activity is resumed too soon. TREATMENT Treatment first involves ice, medicine, and elevation of the leg to reduce pain and inflammation. People with fibular fractures are advised to walk using crutches. A brace or walking boot may be given to restrain the injured leg and allow for healing. Sometimes, surgery is needed to place a rod, plate, or screws in the bones in order to fix the fracture. After surgery, the leg is restrained. After restraint (with or without surgery), it is important to complete strengthening and stretching exercises to regain strength and a full range of motion. Exercises may be completed at home or with a therapist. MEDICATION   If pain medicine is needed, nonsteroidal anti-inflammatory medicines (aspirin and ibuprofen), or other minor pain relievers (acetaminophen), are often advised.  Do not take pain medicine for 7 days before surgery.  Prescription pain relievers may be given if your caregiver thinks they are needed. Use only as directed and only as much as you need. SEEK MEDICAL CARE IF:  Symptoms get worse or do not improve in 2 weeks, despite treatment.  The following occur after restraint or surgery. (Report any of these signs immediately):  Swelling above or below the fracture site.  Severe, persistent pain.  Blue  or gray skin below the fracture site, especially under the toenails. Numbness or loss of feeling below the fracture site.  New, unexplained symptoms  develop. (Drugs used in treatment may produce side effects.)

## 2013-12-22 NOTE — ED Notes (Signed)
Pt taken to xray at this time.

## 2013-12-29 DIAGNOSIS — S82831A Other fracture of upper and lower end of right fibula, initial encounter for closed fracture: Secondary | ICD-10-CM | POA: Diagnosis not present

## 2014-01-18 DIAGNOSIS — M25571 Pain in right ankle and joints of right foot: Secondary | ICD-10-CM | POA: Diagnosis not present

## 2014-01-31 DIAGNOSIS — M25571 Pain in right ankle and joints of right foot: Secondary | ICD-10-CM | POA: Diagnosis not present

## 2014-02-08 DIAGNOSIS — M25571 Pain in right ankle and joints of right foot: Secondary | ICD-10-CM | POA: Diagnosis not present

## 2014-03-24 DIAGNOSIS — M25571 Pain in right ankle and joints of right foot: Secondary | ICD-10-CM | POA: Diagnosis not present

## 2014-03-31 DIAGNOSIS — J309 Allergic rhinitis, unspecified: Secondary | ICD-10-CM | POA: Diagnosis not present

## 2014-03-31 DIAGNOSIS — K589 Irritable bowel syndrome without diarrhea: Secondary | ICD-10-CM | POA: Diagnosis not present

## 2014-03-31 DIAGNOSIS — N3010 Interstitial cystitis (chronic) without hematuria: Secondary | ICD-10-CM | POA: Diagnosis not present

## 2014-03-31 DIAGNOSIS — E780 Pure hypercholesterolemia: Secondary | ICD-10-CM | POA: Diagnosis not present

## 2014-03-31 DIAGNOSIS — L259 Unspecified contact dermatitis, unspecified cause: Secondary | ICD-10-CM | POA: Diagnosis not present

## 2014-03-31 DIAGNOSIS — F411 Generalized anxiety disorder: Secondary | ICD-10-CM | POA: Diagnosis not present

## 2014-05-03 DIAGNOSIS — M25571 Pain in right ankle and joints of right foot: Secondary | ICD-10-CM | POA: Diagnosis not present

## 2014-05-19 DIAGNOSIS — M25511 Pain in right shoulder: Secondary | ICD-10-CM | POA: Diagnosis not present

## 2014-05-19 DIAGNOSIS — S82831D Other fracture of upper and lower end of right fibula, subsequent encounter for closed fracture with routine healing: Secondary | ICD-10-CM | POA: Diagnosis not present

## 2014-06-01 DIAGNOSIS — Z9889 Other specified postprocedural states: Secondary | ICD-10-CM | POA: Diagnosis not present

## 2014-06-01 DIAGNOSIS — R399 Unspecified symptoms and signs involving the genitourinary system: Secondary | ICD-10-CM | POA: Diagnosis not present

## 2014-06-01 DIAGNOSIS — N3941 Urge incontinence: Secondary | ICD-10-CM | POA: Diagnosis not present

## 2014-06-01 DIAGNOSIS — N390 Urinary tract infection, site not specified: Secondary | ICD-10-CM | POA: Diagnosis not present

## 2014-06-01 DIAGNOSIS — Z09 Encounter for follow-up examination after completed treatment for conditions other than malignant neoplasm: Secondary | ICD-10-CM | POA: Diagnosis not present

## 2014-07-19 DIAGNOSIS — M25571 Pain in right ankle and joints of right foot: Secondary | ICD-10-CM | POA: Diagnosis not present

## 2014-08-25 DIAGNOSIS — Z1231 Encounter for screening mammogram for malignant neoplasm of breast: Secondary | ICD-10-CM | POA: Diagnosis not present

## 2014-08-29 DIAGNOSIS — N390 Urinary tract infection, site not specified: Secondary | ICD-10-CM | POA: Diagnosis not present

## 2014-08-29 DIAGNOSIS — Z9889 Other specified postprocedural states: Secondary | ICD-10-CM | POA: Diagnosis not present

## 2014-08-29 DIAGNOSIS — R399 Unspecified symptoms and signs involving the genitourinary system: Secondary | ICD-10-CM | POA: Diagnosis not present

## 2014-08-29 DIAGNOSIS — N3941 Urge incontinence: Secondary | ICD-10-CM | POA: Diagnosis not present

## 2014-08-29 DIAGNOSIS — Z09 Encounter for follow-up examination after completed treatment for conditions other than malignant neoplasm: Secondary | ICD-10-CM | POA: Diagnosis not present

## 2014-09-11 ENCOUNTER — Other Ambulatory Visit: Payer: Self-pay | Admitting: Internal Medicine

## 2014-09-13 DIAGNOSIS — M25571 Pain in right ankle and joints of right foot: Secondary | ICD-10-CM | POA: Diagnosis not present

## 2014-09-13 DIAGNOSIS — M19071 Primary osteoarthritis, right ankle and foot: Secondary | ICD-10-CM | POA: Diagnosis not present

## 2014-10-27 ENCOUNTER — Encounter: Payer: Self-pay | Admitting: Internal Medicine

## 2014-11-30 IMAGING — CR DG ANKLE COMPLETE 3+V*R*
3 series · 3 of 3 positions shown · non-contrast
Comparison: None.

CLINICAL DATA: Patient tripped and fell. Bruising and swelling
laterally

EXAM:
RIGHT ANKLE - COMPLETE 3+ VIEW

[x ankle ap right]
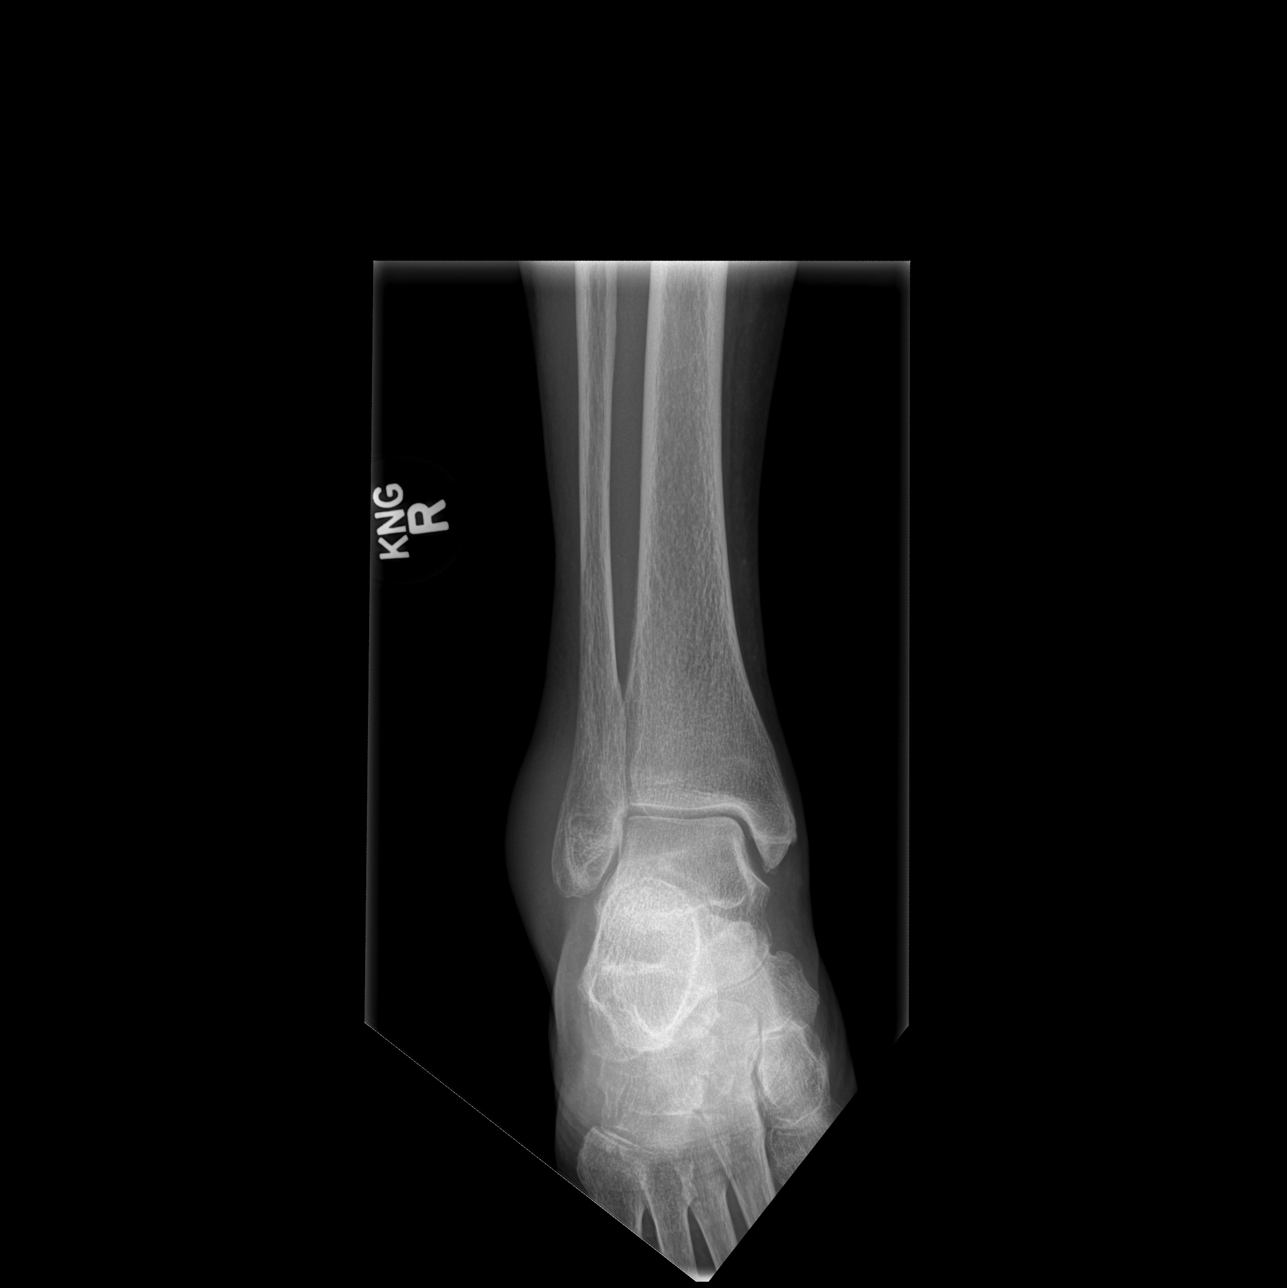

[x ankle obl right]
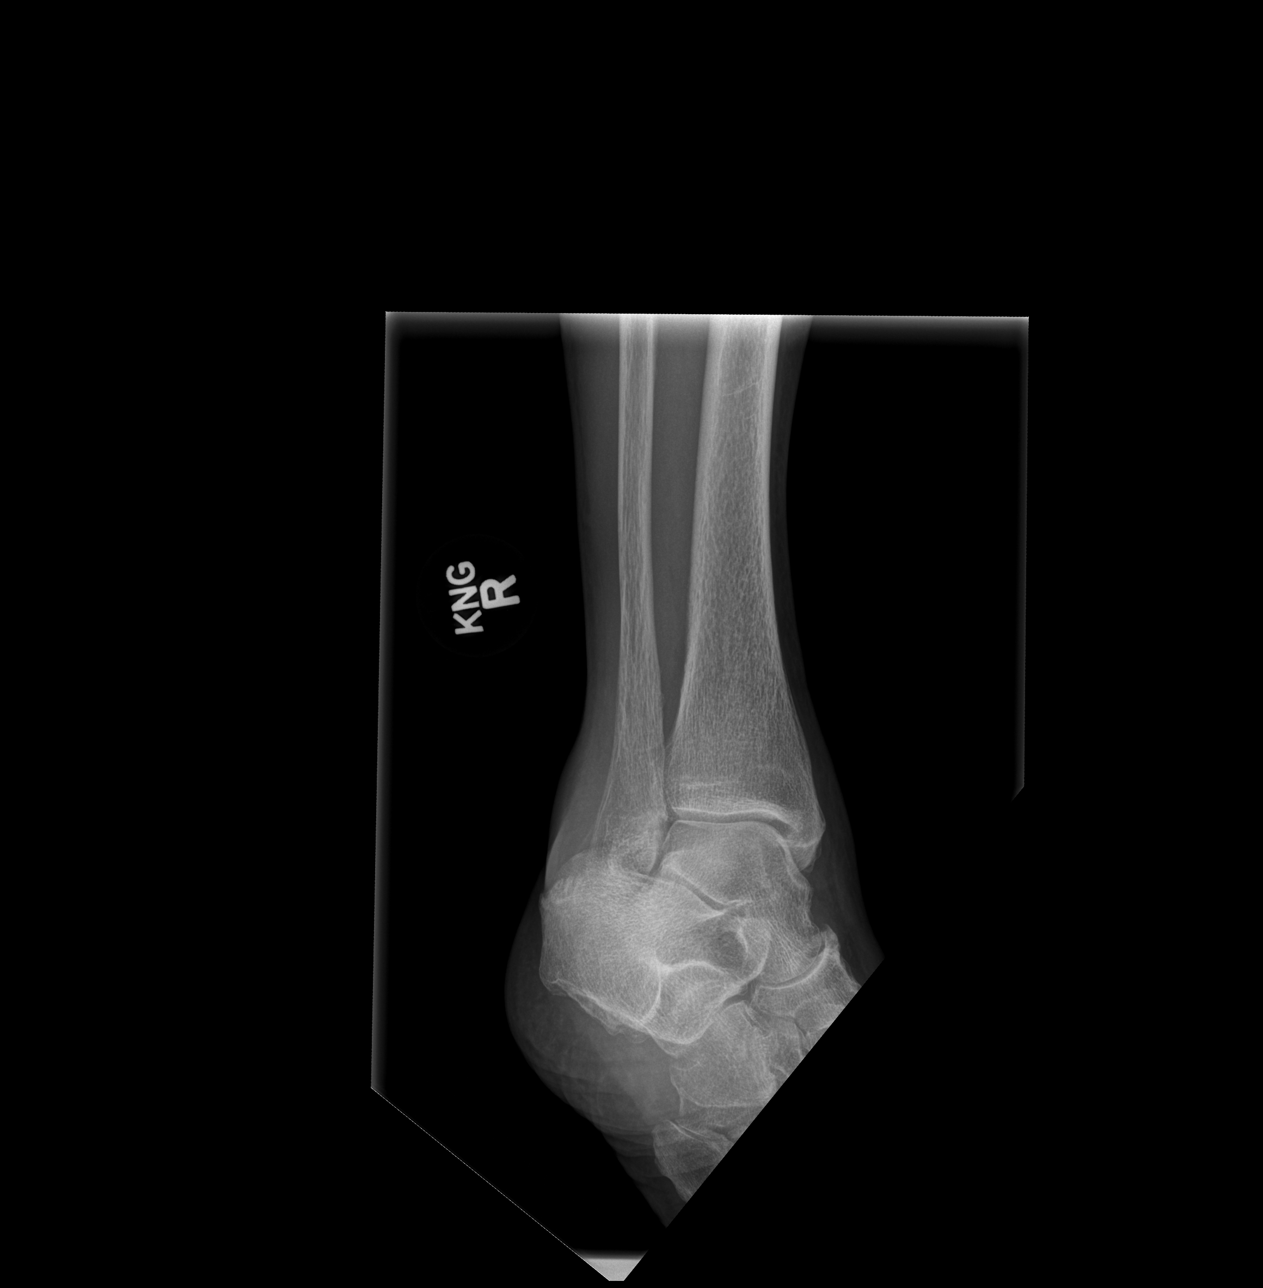

[x ankle lat right]
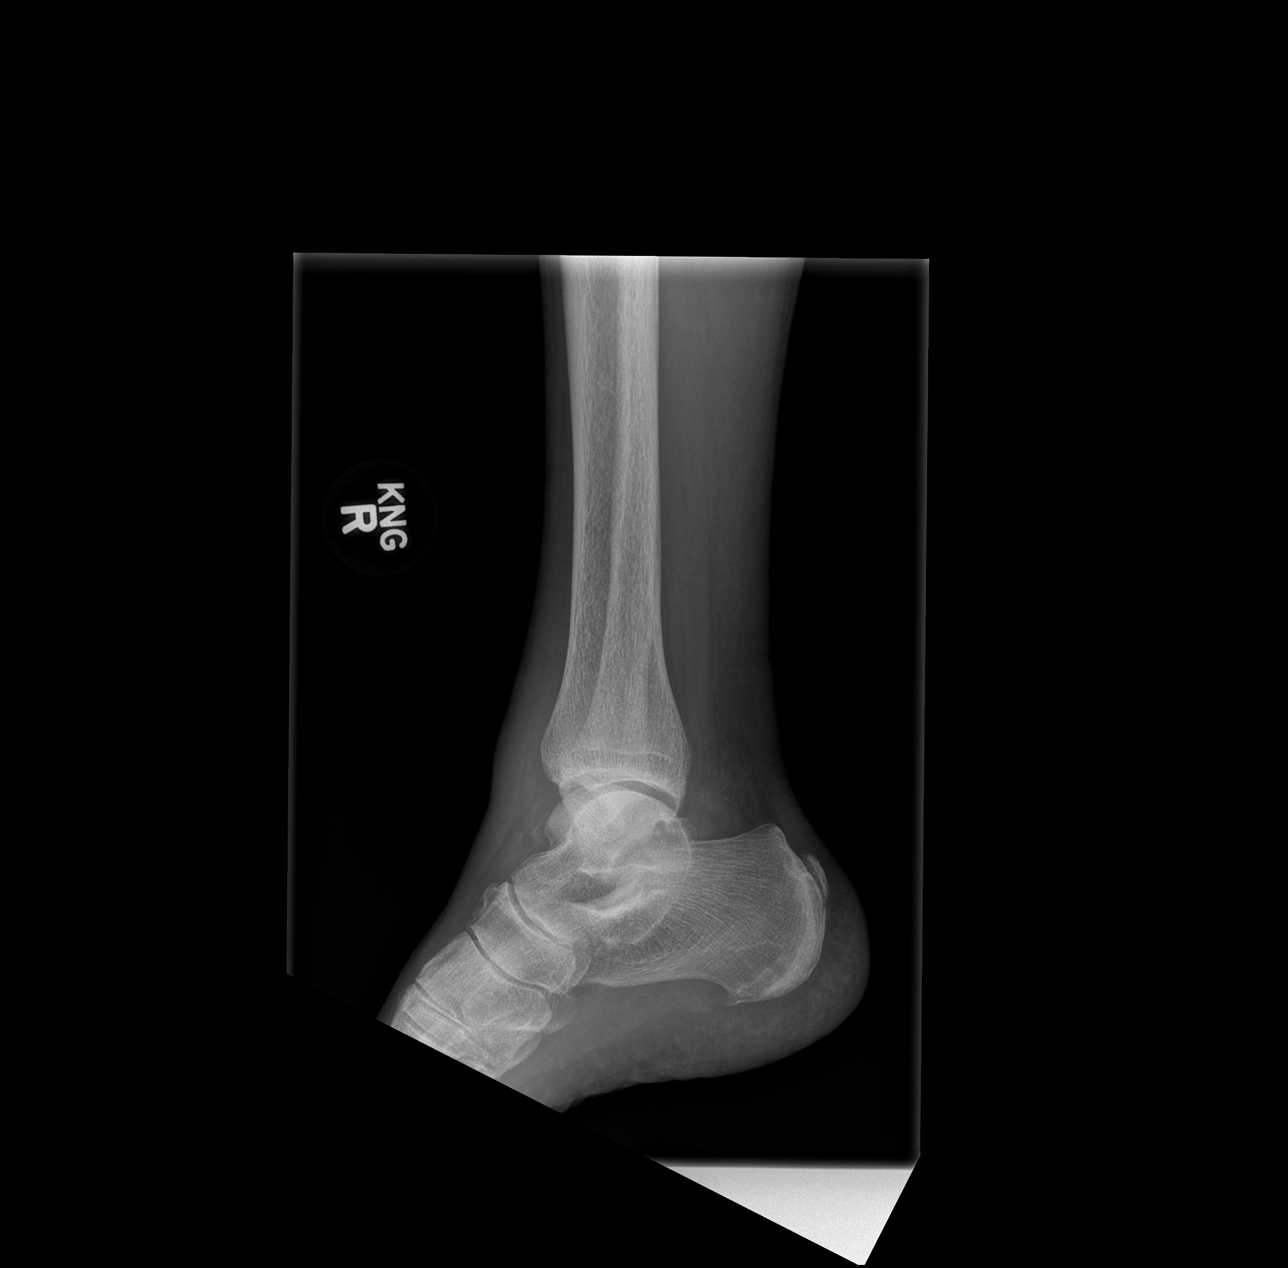

[3 of 3 positions shown; findings below may reference images not displayed]

FINDINGS: Frontal, oblique, and lateral views were obtained. There is marked
swelling laterally. There is a nondisplaced fracture of the distal
fibular metaphysis. There is a sclerotic area in the mid talus which
is concerning for an impaction type injury in the talus. There is
loss of anatomic detail in the anterior subtalar joint region on the
lateral view, concerning for a potential anterior calcaneal fracture
is well. Ankle mortise appears grossly intact. No appreciable
effusion.
IMPRESSION: Marked soft tissue swelling laterally. Linear fracture distal
fibular metaphysis. Findings concerning for impaction type injury in
the talus as well as potential depression type injury in the
anterior calcaneus based on loss of anterior subtalar joint detail
on the lateral view. These findings may warrant hindfoot CT to
further assess. Ankle mortise appears grossly intact.

## 2014-11-30 IMAGING — CR DG TIBIA/FIBULA 2V*R*
2 series · 2 of 2 positions shown · non-contrast
Comparison: None.

CLINICAL DATA: Status post fall.

EXAM:
RIGHT TIBIA AND FIBULA - 2 VIEW

[x tib-fib ap right]
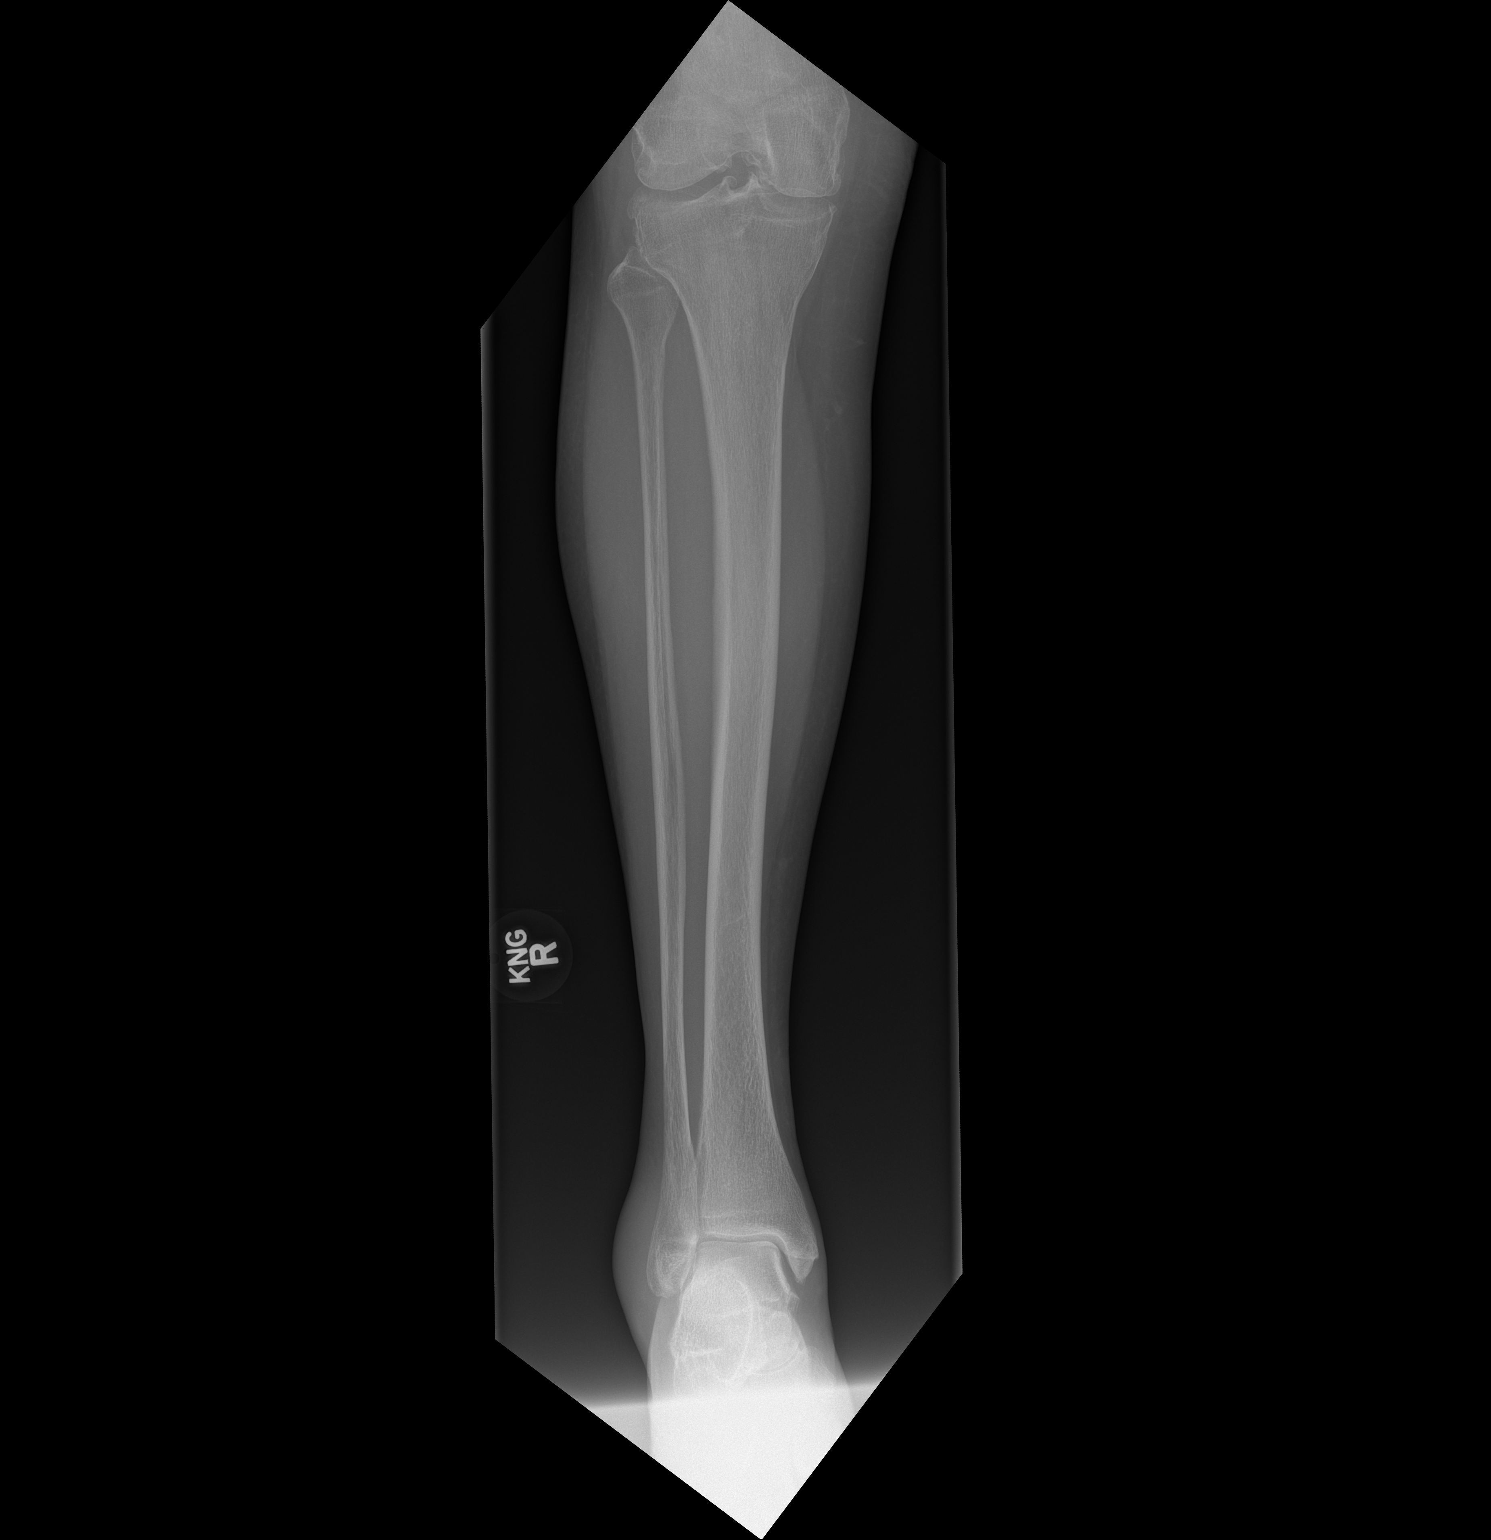

[x tib-fib lat right]
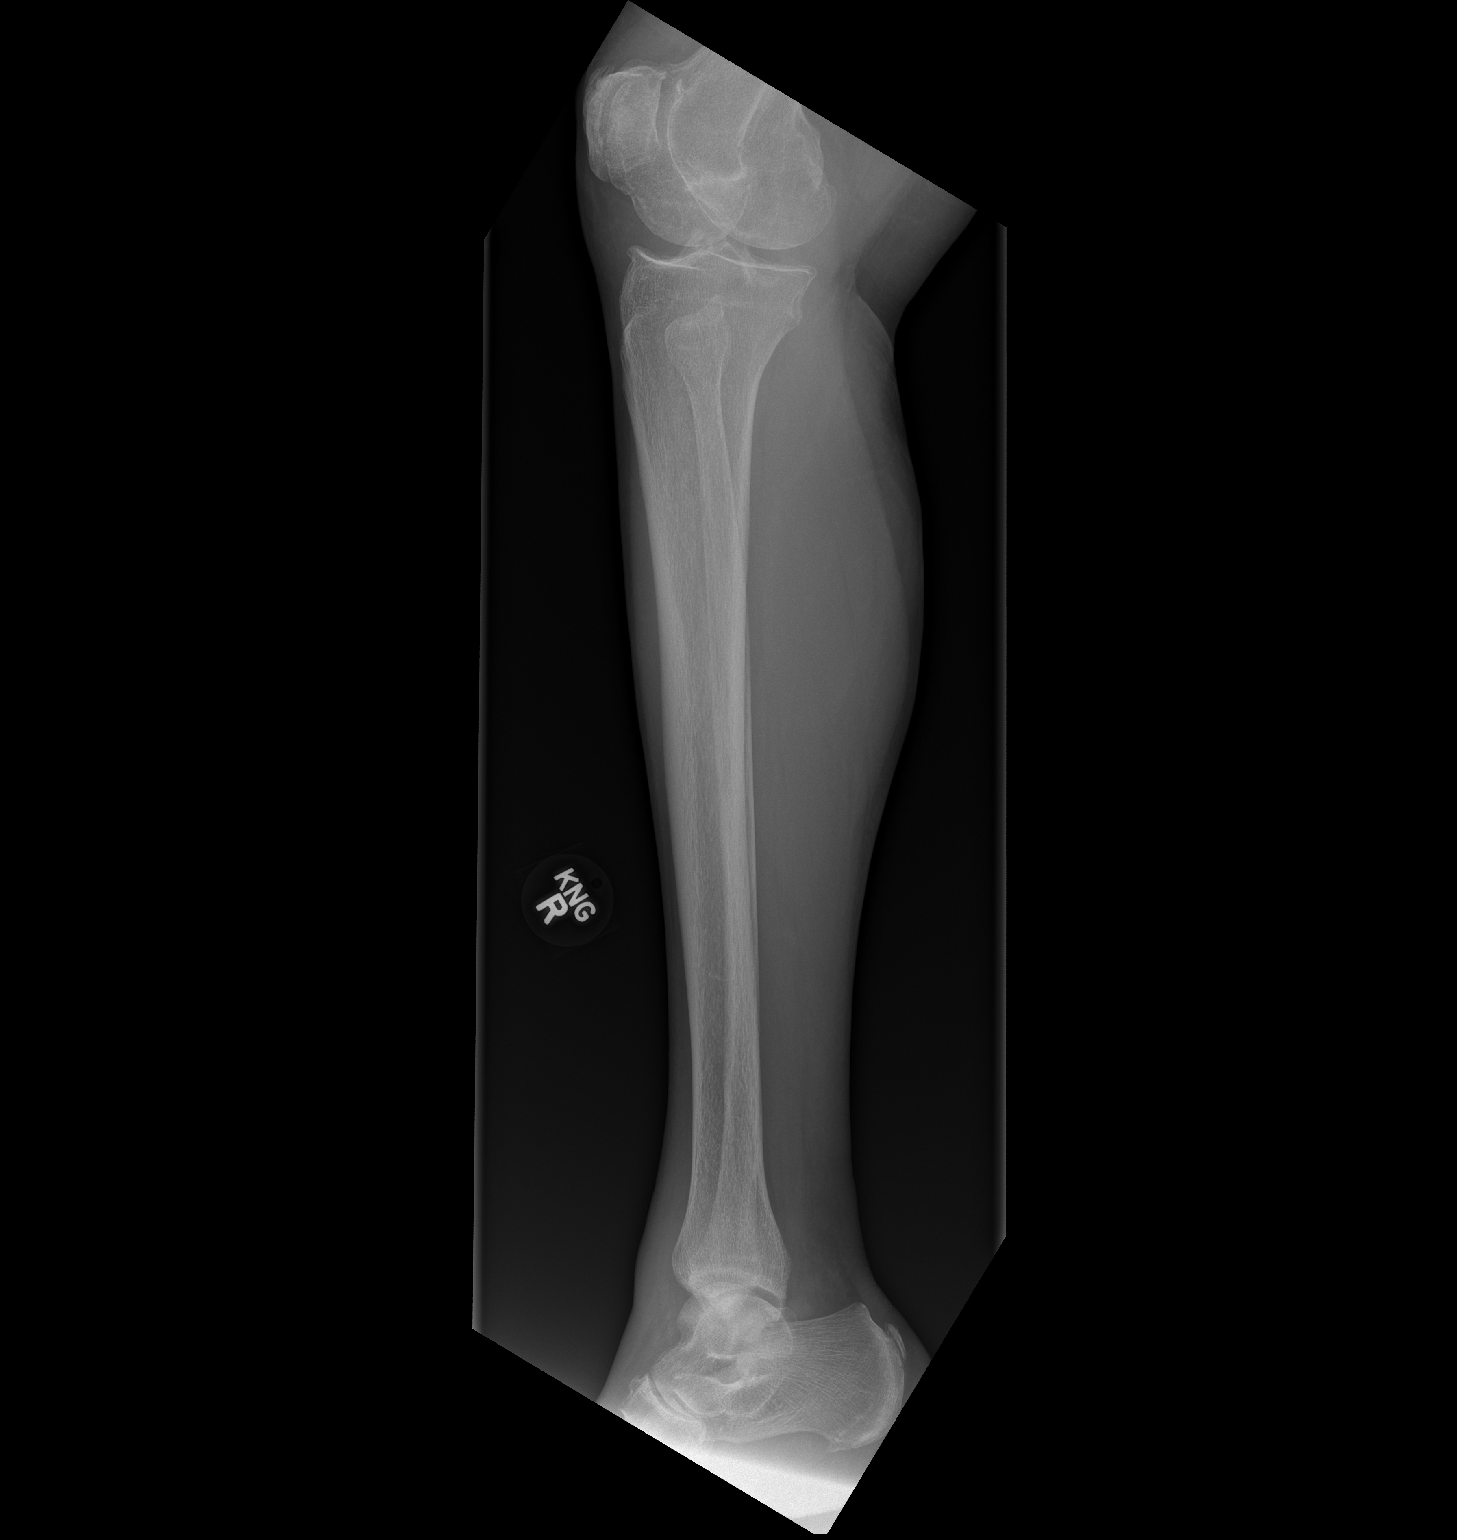

[2 of 2 positions shown; findings below may reference images not displayed]

FINDINGS: There is no evidence of fracture or other focal bone lesions.
Advanced degenerative changes noted within the knee. Soft tissues
are unremarkable.
IMPRESSION: No acute findings.

## 2015-01-17 DIAGNOSIS — K589 Irritable bowel syndrome without diarrhea: Secondary | ICD-10-CM | POA: Diagnosis not present

## 2015-02-26 DIAGNOSIS — R3914 Feeling of incomplete bladder emptying: Secondary | ICD-10-CM | POA: Diagnosis not present

## 2015-02-26 DIAGNOSIS — J019 Acute sinusitis, unspecified: Secondary | ICD-10-CM | POA: Diagnosis not present

## 2015-03-26 ENCOUNTER — Encounter: Payer: Self-pay | Admitting: Gastroenterology

## 2015-03-26 ENCOUNTER — Ambulatory Visit (INDEPENDENT_AMBULATORY_CARE_PROVIDER_SITE_OTHER): Payer: PPO | Admitting: Gastroenterology

## 2015-03-26 VITALS — BP 164/70 | HR 88 | Ht 65.0 in | Wt 141.2 lb

## 2015-03-26 DIAGNOSIS — R110 Nausea: Secondary | ICD-10-CM

## 2015-03-26 DIAGNOSIS — K589 Irritable bowel syndrome without diarrhea: Secondary | ICD-10-CM | POA: Diagnosis not present

## 2015-03-26 DIAGNOSIS — K591 Functional diarrhea: Secondary | ICD-10-CM | POA: Diagnosis not present

## 2015-03-26 NOTE — Progress Notes (Signed)
[name]    [id number]    1940-07-23  Primary Care Physician:[name], MD  Referring Physician: [name], MD [name], [name] [address]  Chief complaint:  IBS HPI: 75 year old white female with diarrhea predominant irritable bowel syndrome previously followed by Dr. Olevia Perches here to establish care . She has a history of severe interstitial cystitis followed by Dr.Evans. She had a complete cystectomy and neobladder formation. She had worsening of her IBS symptoms after she took amoxicillin for 4-5 days in end of January , her symptoms improved with Protonix, Zofran and Levsin  Her last colonoscopy in 2012 was normal. Her weight has been stable. She denies rectal bleeding and she denies dysphagia or heartburn. EGD November 2015 with biopsies was normal Her diarrhea has since stopped that she continues to have some formed bowel movements.     Outpatient Encounter Prescriptions as of 03/26/2015  Medication Sig  . B Complex Vitamins (VITAMIN-B COMPLEX PO) Take 1 tablet by mouth daily.  . Calcium Citrate (CITRACAL PO) Take 1 tablet by mouth 2 (two) times daily.  . clonazePAM (KLONOPIN) 0.5 MG tablet Take 0.5 mg by mouth as needed for anxiety (anxiety).   . nitrofurantoin (MACRODANTIN) 50 MG capsule Take 50 mg by mouth daily.    . Omega-3 Fatty Acids (FISH OIL PO) Take 1 tablet by mouth 3 (three) times daily.  . pantoprazole (PROTONIX) 40 MG tablet TAKE 1 TABLET BY MOUTH ONCE DAILY  . hyoscyamine (LEVSIN SL) 0.125 MG SL tablet PLACE 1 TABLET UNDER THE TONGUE 3 TIMES A DAY (Patient not taking: Reported on 12/22/2013)  . ondansetron (ZOFRAN) 8 MG tablet Take 1 tablet (8 mg total) by mouth every 8 (eight) hours as needed for nausea or vomiting. (Patient not taking: Reported on 03/26/2015)  . [DISCONTINUED] ampicillin (PRINCIPEN) 500 MG capsule Take 500 mg by mouth 2 (two) times daily.  . [DISCONTINUED] oxyCODONE-acetaminophen (PERCOCET/ROXICET)  5-325 MG per tablet Take 1-2 tablets by mouth every 6 (six) hours as needed for moderate pain or severe pain.  . [DISCONTINUED] Pseudoephedrine HCl (SUDAFED PO) Take 1 tablet by mouth daily as needed (pain).   No facility-administered encounter medications on file as of 03/26/2015.    Allergies as of 03/26/2015  . (No Known Allergies)    Past Medical History  Diagnosis Date  . Anxiety   . Fibromyalgia   . History of colon polyps     hyperplastic  . History of uterine cancer   . Irritable bowel syndrome   . Chronic interstitial cystitis   . Cataract     Past Surgical History  Procedure Laterality Date  . Abdominal hysterectomy    . Bladder surgery    . Tonsillectomy    . Back surgery      L5  . Cataract extraction    . Colonoscopy  2012    normal   . Esophagogastroduodenoscopy  2007    normal    Family History  Problem Relation Age of Onset  . Breast cancer Maternal Aunt     cousin  . Colon polyps Sister   . Colon cancer Neg Hx   . Esophageal cancer Neg Hx   . Stomach cancer Neg Hx   . Breast cancer Sister   . Lung cancer Father     smoker  . Breast cancer Cousin     Social History   Social History  . Marital Status: Married  Spouse Name: N/A  . Number of Children: 2  . Years of Education: N/A   Occupational History  . housewife    Social History Main Topics  . Smoking status: Never Smoker   . Smokeless tobacco: Never Used  . Alcohol Use: Yes     Comment: occasionally  . Drug Use: No  . Sexual Activity: Not on file   Other Topics Concern  . Not on file   Social History Narrative      Review of systems: Review of Systems  Constitutional: Negative for fever and chills.  HENT: Negative.   Eyes: Negative for blurred vision.  Respiratory: Negative for cough, shortness of breath and wheezing.   Cardiovascular: Negative for chest pain and palpitations.  Gastrointestinal: as per HPI Genitourinary: Negative for dysuria, urgency, frequency  and hematuria.  Musculoskeletal: Negative for myalgias, back pain and joint pain.  Skin: Negative for itching and rash.  Neurological: Negative for dizziness, tremors, focal weakness, seizures and loss of consciousness.  Endo/Heme/Allergies: Negative for environmental allergies.  Psychiatric/Behavioral: Negative for depression, suicidal ideas and hallucinations.  All other systems reviewed and are negative.   Physical Exam: Filed Vitals:   03/26/15 1426  BP: 164/70  Pulse: 88   Gen:      No acute distress HEENT:  EOMI, sclera anicteric Neck:     No masses; no thyromegaly Lungs:    Clear to auscultation bilaterally; normal respiratory effort CV:         Regular rate and rhythm; no murmurs Abd:      + bowel sounds; soft, Mild tenderness in the suprapubic area; no palpable masses, no distension Ext:    No edema; adequate peripheral perfusion Skin:      Warm and dry; no rash Neuro: alert and oriented x 3 Psych: normal mood and affect  Data Reviewed: Reviewed  chart and pertinent GI workup as per history of present illness   Assessment and Plan/Recommendations:  75 year old female with history of interstitial cystitis status post cystectomy with neobladder and irritable bowel syndrome predominant diarrhea here for follow-up visit She had a flare of her symptoms after a course of antibiotics and has improved since Empiric trial of probiotic align once a day for 2-4 weeks Continue Levsin and Protonix as needed for heartburn and nausea Return as needed Due for colonoscopy in 2022  K. Denzil Magnuson , MD [phone] Mon-Fri 8a-5p [phone] after 5p, weekends, holidays

## 2015-03-26 NOTE — Patient Instructions (Signed)
Take Align 1 a day for 2-4 weeks Call for Protonix refills as needed Follow up as needed

## 2015-04-03 DIAGNOSIS — K589 Irritable bowel syndrome without diarrhea: Secondary | ICD-10-CM | POA: Diagnosis not present

## 2015-04-03 DIAGNOSIS — F411 Generalized anxiety disorder: Secondary | ICD-10-CM | POA: Diagnosis not present

## 2015-04-03 DIAGNOSIS — R42 Dizziness and giddiness: Secondary | ICD-10-CM | POA: Diagnosis not present

## 2015-04-03 DIAGNOSIS — N3010 Interstitial cystitis (chronic) without hematuria: Secondary | ICD-10-CM | POA: Diagnosis not present

## 2015-04-03 DIAGNOSIS — J309 Allergic rhinitis, unspecified: Secondary | ICD-10-CM | POA: Diagnosis not present

## 2015-04-03 DIAGNOSIS — R6883 Chills (without fever): Secondary | ICD-10-CM | POA: Diagnosis not present

## 2015-04-03 DIAGNOSIS — E7800 Pure hypercholesterolemia, unspecified: Secondary | ICD-10-CM | POA: Diagnosis not present

## 2015-05-24 DIAGNOSIS — R3914 Feeling of incomplete bladder emptying: Secondary | ICD-10-CM | POA: Diagnosis not present

## 2015-06-19 DIAGNOSIS — D239 Other benign neoplasm of skin, unspecified: Secondary | ICD-10-CM | POA: Diagnosis not present

## 2015-06-19 DIAGNOSIS — L570 Actinic keratosis: Secondary | ICD-10-CM | POA: Diagnosis not present

## 2015-06-19 DIAGNOSIS — L309 Dermatitis, unspecified: Secondary | ICD-10-CM | POA: Diagnosis not present

## 2015-08-13 DIAGNOSIS — R110 Nausea: Secondary | ICD-10-CM | POA: Diagnosis not present

## 2015-08-13 DIAGNOSIS — R030 Elevated blood-pressure reading, without diagnosis of hypertension: Secondary | ICD-10-CM | POA: Diagnosis not present

## 2015-08-13 DIAGNOSIS — F411 Generalized anxiety disorder: Secondary | ICD-10-CM | POA: Diagnosis not present

## 2015-08-13 DIAGNOSIS — R000 Tachycardia, unspecified: Secondary | ICD-10-CM | POA: Diagnosis not present

## 2015-08-14 DIAGNOSIS — R000 Tachycardia, unspecified: Secondary | ICD-10-CM | POA: Insufficient documentation

## 2015-08-17 ENCOUNTER — Telehealth: Payer: Self-pay | Admitting: *Deleted

## 2015-08-17 MED ORDER — PANTOPRAZOLE SODIUM 40 MG PO TBEC: 40.0000 mg | DELAYED_RELEASE_TABLET | Freq: Every day | ORAL | Status: DC

## 2015-08-17 NOTE — Telephone Encounter (Signed)
Refilled patients protonix per fax request from pharmacy

## 2015-08-20 ENCOUNTER — Ambulatory Visit (INDEPENDENT_AMBULATORY_CARE_PROVIDER_SITE_OTHER): Payer: PPO

## 2015-08-20 DIAGNOSIS — R000 Tachycardia, unspecified: Secondary | ICD-10-CM | POA: Diagnosis not present

## 2015-08-30 DIAGNOSIS — N3010 Interstitial cystitis (chronic) without hematuria: Secondary | ICD-10-CM | POA: Diagnosis not present

## 2015-08-30 DIAGNOSIS — N390 Urinary tract infection, site not specified: Secondary | ICD-10-CM | POA: Diagnosis not present

## 2015-08-30 DIAGNOSIS — Z9889 Other specified postprocedural states: Secondary | ICD-10-CM | POA: Diagnosis not present

## 2015-08-30 DIAGNOSIS — T83511S Infection and inflammatory reaction due to indwelling urethral catheter, sequela: Secondary | ICD-10-CM | POA: Diagnosis not present

## 2015-09-04 DIAGNOSIS — R3914 Feeling of incomplete bladder emptying: Secondary | ICD-10-CM | POA: Diagnosis not present

## 2015-09-05 DIAGNOSIS — F411 Generalized anxiety disorder: Secondary | ICD-10-CM | POA: Diagnosis not present

## 2015-09-05 DIAGNOSIS — R110 Nausea: Secondary | ICD-10-CM | POA: Diagnosis not present

## 2015-09-05 DIAGNOSIS — I471 Supraventricular tachycardia: Secondary | ICD-10-CM | POA: Diagnosis not present

## 2015-09-21 DIAGNOSIS — Z803 Family history of malignant neoplasm of breast: Secondary | ICD-10-CM | POA: Diagnosis not present

## 2015-09-21 DIAGNOSIS — Z1231 Encounter for screening mammogram for malignant neoplasm of breast: Secondary | ICD-10-CM | POA: Diagnosis not present

## 2015-11-23 DIAGNOSIS — J019 Acute sinusitis, unspecified: Secondary | ICD-10-CM | POA: Diagnosis not present

## 2015-12-03 DIAGNOSIS — R3914 Feeling of incomplete bladder emptying: Secondary | ICD-10-CM | POA: Diagnosis not present

## 2015-12-22 ENCOUNTER — Ambulatory Visit (INDEPENDENT_AMBULATORY_CARE_PROVIDER_SITE_OTHER): Payer: PPO | Admitting: Physician Assistant

## 2015-12-22 VITALS — BP 118/72 | HR 93 | Temp 99.4°F | Resp 18 | Ht 65.0 in | Wt 134.0 lb

## 2015-12-22 DIAGNOSIS — B9689 Other specified bacterial agents as the cause of diseases classified elsewhere: Secondary | ICD-10-CM | POA: Diagnosis not present

## 2015-12-22 DIAGNOSIS — J019 Acute sinusitis, unspecified: Secondary | ICD-10-CM

## 2015-12-22 MED ORDER — LEVOFLOXACIN 500 MG PO TABS: 500.0000 mg | ORAL_TABLET | Freq: Every day | ORAL | 0 refills | Status: DC

## 2015-12-22 NOTE — Progress Notes (Signed)
   12/22/2015 2:12 PM   DOB: 11-27-1940 / MRN: [id number]  SUBJECTIVE:  [name] is a 75 y.o. female presenting for an antibiotic change.  States she has been taking amox for a sinus infection and she is not able to tolerate that Amoxicillin due to nausea.  Reports she continues to have facial pain, frontal sinus HA/pressure, nasal congestion, as well some gingival aching. These symptoms have been ongoing for about 4 weeks now.   She has No Known Allergies.   She  has a past medical history of Anxiety; Cataract; Chronic interstitial cystitis; Fibromyalgia; History of colon polyps; History of uterine cancer; and Irritable bowel syndrome.    She  reports that she has never smoked. She has never used smokeless tobacco. She reports that she drinks alcohol. She reports that she does not use drugs. She  has no sexual activity history on file. The patient  has a past surgical history that includes Abdominal hysterectomy; Bladder surgery; Tonsillectomy; Back surgery; Cataract extraction; Colonoscopy (2012); and Esophagogastroduodenoscopy (2007).  Her family history includes Breast cancer in her cousin, maternal aunt, and sister; Cancer in her brother; Colon polyps in her sister; Lung cancer in her father.  Review of Systems  Constitutional: Positive for chills and fever.  Gastrointestinal: Negative for abdominal pain.  Genitourinary: Negative for dysuria.  Skin: Negative for rash.  Neurological: Negative for dizziness.    The problem list and medications were reviewed and updated by myself where necessary and exist elsewhere in the encounter.   OBJECTIVE:  BP 118/72 (BP Location: Right Arm, Patient Position: Sitting, Cuff Size: Small)   Pulse 93   Temp 99.4 F (37.4 C) (Oral)   Resp 18   Ht 5\' 5"  (1.651 m)   Wt 134 lb (60.8 kg)   SpO2 97%   BMI 22.30 kg/m   Physical Exam  Constitutional: She is oriented to person, place, and time. She appears well-developed and well-nourished.    HENT:  Head:    Cardiovascular: Normal rate, regular rhythm and normal heart sounds.   Pulmonary/Chest: Effort normal and breath sounds normal.  Musculoskeletal: Normal range of motion.  Neurological: She is alert and oriented to person, place, and time.  Skin: Skin is warm and dry.  Psychiatric: She has a normal mood and affect.    No results found for this or any previous visit (from the past 72 hour(s)).  No results found. o ASSESSMENT AND PLAN  [name] was seen today for sinusitis.  Diagnoses and all orders for this visit:  Acute bacterial rhinosinusitis: She can not tolerate Amox due to nausea.  Will step on therapy and advised that she eat while taking to reduce possibility of nausea.  -     levofloxacin (LEVAQUIN) 500 MG tablet; Take 1 tablet (500 mg total) by mouth daily.    The patient is advised to call or return to clinic if she does not see an improvement in symptoms, or to seek the care of the closest emergency department if she worsens with the above plan.   Philis Fendt, MHS, PA-C [facility] 12/22/2015 2:12 PM

## 2015-12-22 NOTE — Patient Instructions (Signed)
     IF you received an x-ray today, you will receive an invoice from [facility]. Please contact [facility] at [phone] with questions or concerns regarding your invoice.   IF you received labwork today, you will receive an invoice from [facility]. Please contact [facility] at [phone] with questions or concerns regarding your invoice.   Our billing staff will not be able to assist you with questions regarding bills from these companies.  You will be contacted with the lab results as soon as they are available. The fastest way to get your results is to activate your My Chart account. Instructions are located on the last page of this paperwork. If you have not heard from us regarding the results in 2 weeks, please contact this office.      

## 2016-01-29 DIAGNOSIS — J069 Acute upper respiratory infection, unspecified: Secondary | ICD-10-CM | POA: Diagnosis not present

## 2016-02-26 DIAGNOSIS — R3914 Feeling of incomplete bladder emptying: Secondary | ICD-10-CM | POA: Diagnosis not present

## 2016-02-27 ENCOUNTER — Telehealth: Payer: Self-pay | Admitting: Internal Medicine

## 2016-02-27 NOTE — Telephone Encounter (Signed)
Patient call this evening after hours reporting that she developed problems with right-sided abdominal pain, nausea, and diarrhea 6 days ago. Has a history of IBS. Previous patient Dr. Olevia Perches. Seen one year ago by Dr. Silverio Decamp. I told the patient that I could not provide a medical assessment or twice over the telephone did recommend that she go to the emergency room if she felt she was that ill. Otherwise, I recommended that she contact the office in the morning during regular hours to schedule an appointment to be evaluated. Alternatively, she could see her PCP

## 2016-02-28 ENCOUNTER — Encounter (HOSPITAL_COMMUNITY): Payer: Self-pay | Admitting: Emergency Medicine

## 2016-02-28 ENCOUNTER — Emergency Department (HOSPITAL_COMMUNITY): Payer: PPO

## 2016-02-28 ENCOUNTER — Emergency Department (HOSPITAL_COMMUNITY)
Admission: EM | Admit: 2016-02-28 | Discharge: 2016-02-28 | Disposition: A | Discharge status: Home / Self Care | Payer: PPO | Attending: Emergency Medicine | Admitting: Emergency Medicine

## 2016-02-28 DIAGNOSIS — R197 Diarrhea, unspecified: Secondary | ICD-10-CM | POA: Diagnosis not present

## 2016-02-28 DIAGNOSIS — R109 Unspecified abdominal pain: Secondary | ICD-10-CM | POA: Diagnosis not present

## 2016-02-28 DIAGNOSIS — N3000 Acute cystitis without hematuria: Secondary | ICD-10-CM | POA: Insufficient documentation

## 2016-02-28 DIAGNOSIS — R1110 Vomiting, unspecified: Secondary | ICD-10-CM | POA: Diagnosis not present

## 2016-02-28 DIAGNOSIS — R1084 Generalized abdominal pain: Secondary | ICD-10-CM | POA: Insufficient documentation

## 2016-02-28 DIAGNOSIS — R112 Nausea with vomiting, unspecified: Secondary | ICD-10-CM | POA: Diagnosis not present

## 2016-02-28 LAB — CBC
HCT: 40.5 % (ref 36.0–46.0)
Hemoglobin: 13.2 g/dL (ref 12.0–15.0)
MCH: 28.7 pg (ref 26.0–34.0)
MCHC: 32.6 g/dL (ref 30.0–36.0)
MCV: 88 fL (ref 78.0–100.0)
PLATELETS: 383 10*3/uL (ref 150–400)
RBC: 4.6 MIL/uL (ref 3.87–5.11)
RDW: 12.9 % (ref 11.5–15.5)
WBC: 9.1 10*3/uL (ref 4.0–10.5)

## 2016-02-28 LAB — COMPREHENSIVE METABOLIC PANEL
ALK PHOS: 86 U/L (ref 38–126)
ALT: 13 U/L — AB (ref 14–54)
AST: 19 U/L (ref 15–41)
Albumin: 3.6 g/dL (ref 3.5–5.0)
Anion gap: 8 (ref 5–15)
BUN: 13 mg/dL (ref 6–20)
CALCIUM: 9 mg/dL (ref 8.9–10.3)
CO2: 25 mmol/L (ref 22–32)
CREATININE: 0.93 mg/dL (ref 0.44–1.00)
Chloride: 108 mmol/L (ref 101–111)
GFR calc Af Amer: >60 mL/min (ref >60)
GFR, EST NON AFRICAN AMERICAN: 60 mL/min — AB (ref >60)
Glucose, Bld: 102 mg/dL — ABNORMAL HIGH (ref 65–99)
Potassium: 3.8 mmol/L (ref 3.5–5.1)
Sodium: 141 mmol/L (ref 135–145)
Total Bilirubin: 0.4 mg/dL (ref 0.3–1.2)
Total Protein: 6.7 g/dL (ref 6.5–8.1)

## 2016-02-28 LAB — URINALYSIS, ROUTINE W REFLEX MICROSCOPIC
Bilirubin Urine: NEGATIVE
Glucose, UA: NEGATIVE mg/dL
Ketones, ur: NEGATIVE mg/dL
Nitrite: POSITIVE — AB
Protein, ur: NEGATIVE mg/dL
Specific Gravity, Urine: 1.009 (ref 1.005–1.030)
pH: 7 (ref 5.0–8.0)

## 2016-02-28 LAB — LIPASE, BLOOD: Lipase: 22 U/L (ref 11–51)

## 2016-02-28 MED ORDER — CEFTRIAXONE SODIUM 1 G IJ SOLR
1.0000 g | Freq: Once | INTRAMUSCULAR | Status: AC
  Administered 2016-02-28: 1 g via INTRAVENOUS
  Filled 2016-02-28: qty 10

## 2016-02-28 MED ORDER — SODIUM CHLORIDE 0.9 % IV BOLUS (SEPSIS)
1000.0000 mL | Freq: Once | INTRAVENOUS | Status: AC
  Administered 2016-02-28: 1000 mL via INTRAVENOUS

## 2016-02-28 MED ORDER — CEPHALEXIN 500 MG PO CAPS: 500.0000 mg | ORAL_CAPSULE | Freq: Two times a day (BID) | ORAL | 0 refills | Status: DC

## 2016-02-28 MED ORDER — IOPAMIDOL (ISOVUE-300) INJECTION 61%
INTRAVENOUS | Status: AC
  Administered 2016-02-28: 100 mL
  Filled 2016-02-28: qty 100

## 2016-02-28 MED ORDER — SODIUM CHLORIDE 0.9 % IJ SOLN
INTRAMUSCULAR | Status: AC
  Filled 2016-02-28: qty 50

## 2016-02-28 NOTE — Telephone Encounter (Signed)
Patient has already been scheduled

## 2016-02-28 NOTE — ED Provider Notes (Signed)
[facility] DEPT Provider Note   CSN: [id number] Arrival date & time: 02/28/16 0901     History    Chief Complaint  Patient presents with  . Abdominal Pain  . Emesis  . Diarrhea     HPI [name] is a 76 y.o. female.  76yo M w/ PMH including IBS, fibromyalgia, chronic interstitial cystitis who presents with vomiting, diarrhea, and abdominal pain. Patient states that 6 days ago she began having generalized abdominal pain quickly followed by vomiting and diarrhea. The vomiting resolved after a day and she had 3 days of diarrhea for which she took Imodium and eventually stopped having the diarrhea. She has continued to have abdominal pain which she reports as a constant soreness that was severe last night and gets worse with eating. Pain seems to be generalized but worse on her right side of her abdomen. She has had a lot of stomach gurgling. She denies any associated fevers or cough/cold symptoms. No sick contacts or recent travel. No urinary symptoms.   Past Medical History:  Diagnosis Date  . Anxiety   . Cataract   . Chronic interstitial cystitis   . Fibromyalgia   . History of colon polyps    hyperplastic  . History of uterine cancer   . Irritable bowel syndrome      Patient Active Problem List   Diagnosis Date Noted  . Tachycardia 08/14/2015  . ANXIETY 09/17/2007  . IRRITABLE BOWEL SYNDROME 09/17/2007  . INTERSTITIAL CYSTITIS 09/17/2007  . FIBROMYALGIA 09/17/2007  . UTERINE CANCER, HX OF 09/17/2007  . COLONIC POLYPS, HYPERPLASTIC, HX OF 09/17/2007    Past Surgical History:  Procedure Laterality Date  . ABDOMINAL HYSTERECTOMY    . BACK SURGERY     L5  . BLADDER SURGERY    . CATARACT EXTRACTION    . COLONOSCOPY  2012   normal   . ESOPHAGOGASTRODUODENOSCOPY  2007   normal  . TONSILLECTOMY      OB History    No data available        Home Medications    Prior to Admission medications   Medication Sig Start Date End Date Taking? Authorizing  Provider  B Complex Vitamins (VITAMIN-B COMPLEX PO) Take 1 tablet by mouth daily.   Yes Historical Provider, MD  Calcium Citrate (CITRACAL PO) Take 1 tablet by mouth 2 (two) times daily.   Yes Historical Provider, MD  clonazePAM (KLONOPIN) 0.5 MG tablet Take 0.5 mg by mouth daily as needed for anxiety (anxiety).    Yes Historical Provider, MD  hyoscyamine (LEVSIN SL) 0.125 MG SL tablet PLACE 1 TABLET UNDER THE TONGUE 3 TIMES A DAY 11/10/13  Yes Lafayette Dragon, MD  nitrofurantoin (MACRODANTIN) 50 MG capsule Take 50 mg by mouth See admin instructions. Every 3 to 4 days.   Yes Historical Provider, MD  Omega-3 Fatty Acids (FISH OIL PO) Take 1 tablet by mouth 3 (three) times daily.   Yes Historical Provider, MD  ondansetron (ZOFRAN) 8 MG tablet Take 1 tablet (8 mg total) by mouth every 8 (eight) hours as needed for nausea or vomiting. 11/25/13  Yes Lafayette Dragon, MD  pantoprazole (PROTONIX) 40 MG tablet Take 1 tablet (40 mg total) by mouth daily. 08/17/15  Yes Mauri Pole, MD  cephALEXin (KEFLEX) 500 MG capsule Take 1 capsule (500 mg total) by mouth 2 (two) times daily. 02/28/16   Sharlett Iles, MD  levofloxacin (LEVAQUIN) 500 MG tablet Take 1 tablet (500 mg total) by mouth  daily. Patient not taking: Reported on 02/28/2016 12/22/15   Tereasa Coop, PA-C      Family History  Problem Relation Age of Onset  . Breast cancer Maternal Aunt     cousin  . Breast cancer Sister   . Breast cancer Cousin   . Colon polyps Sister   . Lung cancer Father     smoker  . Cancer Brother   . Colon cancer Neg Hx   . Esophageal cancer Neg Hx   . Stomach cancer Neg Hx      Social History  Substance Use Topics  . Smoking status: Never Smoker  . Smokeless tobacco: Never Used  . Alcohol use Yes     Comment: occasionally     Allergies     Patient has no known allergies.    Review of Systems  10 Systems reviewed and are negative for acute change except as noted in the HPI.   Physical  Exam Updated Vital Signs BP 165/65   Pulse 84   Temp 97.9 F (36.6 C) (Oral)   Resp 16   Ht 5\' 5"  (1.651 m)   Wt 138 lb (62.6 kg)   SpO2 100%   BMI 22.96 kg/m   Physical Exam  Constitutional: She is oriented to person, place, and time. She appears well-developed and well-nourished. No distress.  HENT:  Head: Normocephalic and atraumatic.  Moist mucous membranes  Eyes: Conjunctivae are normal. Pupils are equal, round, and reactive to light.  Neck: Neck supple.  Cardiovascular: Normal rate, regular rhythm and normal heart sounds.   No murmur heard. Pulmonary/Chest: Effort normal and breath sounds normal.  Abdominal: Soft. Bowel sounds are normal. She exhibits no distension. There is tenderness. There is no rebound and no guarding.  Generalized TTP worst in RUQ, RLQ, LLQ, no peritonitis  Musculoskeletal: She exhibits no edema.  Neurological: She is alert and oriented to person, place, and time.  Fluent speech  Skin: Skin is warm and dry.  Psychiatric: She has a normal mood and affect. Judgment normal.  Nursing note and vitals reviewed.     ED Treatments / Results  Labs (all labs ordered are listed, but only abnormal results are displayed) Labs Reviewed  URINE CULTURE - Abnormal; Notable for the following:       Result Value   Culture >=100,000 COLONIES/mL ESCHERICHIA COLI (*)    Organism ID, Bacteria ESCHERICHIA COLI (*)    All other components within normal limits  COMPREHENSIVE METABOLIC PANEL - Abnormal; Notable for the following:    Glucose, Bld 102 (*)    ALT 13 (*)    GFR calc non Af Amer 60 (*)    All other components within normal limits  URINALYSIS, ROUTINE W REFLEX MICROSCOPIC - Abnormal; Notable for the following:    APPearance HAZY (*)    Hgb urine dipstick MODERATE (*)    Nitrite POSITIVE (*)    Leukocytes, UA MODERATE (*)    Bacteria, UA MANY (*)    Squamous Epithelial / LPF 0-5 (*)    All other components within normal limits  LIPASE, BLOOD  CBC       EKG  EKG Interpretation  Date/Time:    Ventricular Rate:    PR Interval:    QRS Duration:   QT Interval:    QTC Calculation:   R Axis:     Text Interpretation:           Radiology No results found.  Procedures Procedures (including critical care  time) Procedures  Medications Ordered in ED  Medications  sodium chloride 0.9 % bolus 1,000 mL (0 mLs Intravenous Stopped 02/28/16 1509)  iopamidol (ISOVUE-300) 61 % injection (100 mLs  Contrast Given 02/28/16 1228)  cefTRIAXone (ROCEPHIN) 1 g in dextrose 5 % 50 mL IVPB (0 g Intravenous Stopped 02/28/16 1509)     Initial Impression / Assessment and Plan / ED Course  I have reviewed the triage vital signs and the nursing notes.  Pertinent labs & imaging results that were available during my care of the patient were reviewed by me and considered in my medical decision making (see chart for details).     PT w/ several days of diarrhea and vomiting and now persistent abd pain. She was nontoxic and in no acute distress at presentation. Vital signs notable for hypertension at 171/76. Afebrile. She did have generalized abdominal tenderness which was worse on the right upper quadrant, right lower quadrant, and left lower quadrant without distention or peritonitis. Her lab work shows normal WBC count. UA is consistent with infection, urine culture sent. LFTs and lipase are normal. Because of her persistent abdominal pain, obtained CT of abdomen and pelvis to evaluate for acute pathology such as appendicitis, diverticulitis, or bowel obstruction.  CT showed no acute findings to explain the patient's symptoms. She was comfortable on exam and given that she has had no vomiting or diarrhea in a few days, I feel she is safe for discharge given reassuring VS and abdominal scan. Gave CTX and prescription for keflex to treat UTI and instructed to f/u with urologist for recheck and discussion of whether she should be on prophylactic macrobid again.  Also instructed to f/u with GI with whom she already has upcoming appointment. Reviewed return precautions including fever, intractable vomiting, worsening pain, bloody stools, or new alarming sx. Pt voiced understanding and was discharged in satisfactory condition. Final Clinical Impressions(s) / ED Diagnoses   Final diagnoses:  Acute cystitis without hematuria  Nausea vomiting and diarrhea  Generalized abdominal pain     Discharge Medication List as of 02/28/2016  2:48 PM    START taking these medications   Details  cephALEXin (KEFLEX) 500 MG capsule Take 1 capsule (500 mg total) by mouth 2 (two) times daily., Starting Thu 02/28/2016, Print           Sharlett Iles, MD 03/01/16 2011

## 2016-02-28 NOTE — ED Triage Notes (Signed)
Pt reports RLQ pain that began last night and progressed to n/v/d. Pain is better now. Pain increases with eating.

## 2016-02-28 NOTE — Telephone Encounter (Signed)
[name] can you please follow up and schedule patient for a visit. Thanks

## 2016-02-28 NOTE — Discharge Instructions (Signed)
1. Contact your urologist to discuss whether you need to go back on Macrodantin after you finish antibiotics for urinary tract infection. 2. Follow up with gastroenterology and avoid Aleve/ibuprofen/motrin/advil use. 3. Start taking lactobacillus/probiotics to help with diarrhea. 4. Stop taking immodium. 5. Finish antibiotic course for urinary tract infection.   Return immediately if you have intractable vomiting, blood in stool, fever, or worsening symptoms.

## 2016-02-28 NOTE — ED Notes (Signed)
Patient is A & O x4.  She understood discharge instructions. 

## 2016-03-01 LAB — URINE CULTURE
Culture: >=100000 — AB
Special Requests: NORMAL

## 2016-03-02 ENCOUNTER — Telehealth: Payer: Self-pay

## 2016-03-02 NOTE — Telephone Encounter (Signed)
Post ED Visit - Positive Culture Follow-up  Culture report reviewed by antimicrobial stewardship pharmacist:  []  Elenor Quinones, Pharm.D. []  Heide Guile, Pharm.D., BCPS []  Parks Neptune, Pharm.D. [x]  Alycia Rossetti, Pharm.D., BCPS []  [place], Pharm.D., BCPS, AAHIVP []  Legrand Como, Pharm.D., BCPS, AAHIVP []  Milus Glazier, Pharm.D. []  Stephens November, [place].D.  Positive urine culture Treated with Cephalexin, organism sensitive to the same and no further patient follow-up is required at this time.  Genia Del 03/02/2016, 2:11 PM

## 2016-03-06 ENCOUNTER — Ambulatory Visit: Payer: PPO | Admitting: Gastroenterology

## 2016-03-06 DIAGNOSIS — Z87440 Personal history of urinary (tract) infections: Secondary | ICD-10-CM | POA: Diagnosis not present

## 2016-03-06 DIAGNOSIS — Z9889 Other specified postprocedural states: Secondary | ICD-10-CM | POA: Diagnosis not present

## 2016-03-17 ENCOUNTER — Ambulatory Visit (INDEPENDENT_AMBULATORY_CARE_PROVIDER_SITE_OTHER): Payer: PPO | Admitting: Gastroenterology

## 2016-03-17 ENCOUNTER — Encounter: Payer: Self-pay | Admitting: Gastroenterology

## 2016-03-17 VITALS — BP 152/82 | HR 108 | Ht 65.0 in | Wt 135.5 lb

## 2016-03-17 DIAGNOSIS — R109 Unspecified abdominal pain: Secondary | ICD-10-CM

## 2016-03-17 DIAGNOSIS — R630 Anorexia: Secondary | ICD-10-CM

## 2016-03-17 DIAGNOSIS — R110 Nausea: Secondary | ICD-10-CM | POA: Diagnosis not present

## 2016-03-17 HISTORY — DX: Unspecified abdominal pain: R10.9

## 2016-03-17 MED ORDER — DICYCLOMINE HCL 10 MG PO CAPS: 10.0000 mg | ORAL_CAPSULE | Freq: Two times a day (BID) | ORAL | 1 refills | Status: DC

## 2016-03-17 NOTE — Progress Notes (Signed)
03/17/2016 [name] [id number] Apr 29, 1940   HISTORY OF PRESENT ILLNESS:  This is a 76 year old female with history of IBS, fibromyalgia, and interstitial cystitis who is known to Dr. Silverio Decamp.  She presents today after an acute episode of sudden onset nausea, vomiting, right sided abdominal pain, and diarrhea.  CT scan and labs unremarkable.  Symptoms significantly improved but continues with constant nausea, poor appetite, and dull abdominal pain.  She says that she's never had any pain like that in the past.  When symptoms stared she also had a fever of 102.  Has been using zofran for nausea.  Is not taking her pantoprazole currently but was taking it up until this episode occurred.  She says that every time she puts something on her stomach, even a piece of toast, that she gets nauseated.  Says that she is taking a probiotic gummy.   Past Medical History:  Diagnosis Date  . Anxiety   . Cataract   . Chronic interstitial cystitis   . Fibromyalgia   . History of colon polyps    hyperplastic  . History of uterine cancer   . Irritable bowel syndrome    Past Surgical History:  Procedure Laterality Date  . ABDOMINAL HYSTERECTOMY    . BACK SURGERY     L5  . BLADDER SURGERY    . CATARACT EXTRACTION    . COLONOSCOPY  2012   normal   . ESOPHAGOGASTRODUODENOSCOPY  2007   normal  . TONSILLECTOMY      reports that she has never smoked. She has never used smokeless tobacco. She reports that she drinks alcohol. She reports that she does not use drugs. family history includes Breast cancer in her cousin, maternal aunt, and sister; Cancer in her brother; Colon polyps in her sister; Lung cancer in her father. No Known Allergies    Outpatient Encounter Prescriptions as of 03/17/2016  Medication Sig  . calcium carbonate (OSCAL) 1500 (600 Ca) MG TABS tablet Take 2 tablets by mouth 2 (two) times daily with a meal.  . clonazePAM (KLONOPIN) 0.5 MG tablet Take 0.5 mg by mouth daily as  needed for anxiety (anxiety).   . nitrofurantoin (MACRODANTIN) 50 MG capsule Take 50 mg by mouth See admin instructions. Every 3 to 4 days.  . Omega-3 Fatty Acids (FISH OIL PO) Take 1 tablet by mouth as needed. Occasionally - when she remembers  . ondansetron (ZOFRAN) 8 MG tablet Take 1 tablet (8 mg total) by mouth every 8 (eight) hours as needed for nausea or vomiting.  . [DISCONTINUED] Calcium Citrate (CITRACAL PO) Take 1 tablet by mouth 2 (two) times daily.  . pantoprazole (PROTONIX) 40 MG tablet Take 1 tablet (40 mg total) by mouth daily. (Patient not taking: Reported on 03/17/2016)  . [DISCONTINUED] B Complex Vitamins (VITAMIN-B COMPLEX PO) Take 1 tablet by mouth daily.  . [DISCONTINUED] cephALEXin (KEFLEX) 500 MG capsule Take 1 capsule (500 mg total) by mouth 2 (two) times daily.  . [DISCONTINUED] hyoscyamine (LEVSIN SL) 0.125 MG SL tablet PLACE 1 TABLET UNDER THE TONGUE 3 TIMES A DAY  . [DISCONTINUED] levofloxacin (LEVAQUIN) 500 MG tablet Take 1 tablet (500 mg total) by mouth daily. (Patient not taking: Reported on 02/28/2016)   No facility-administered encounter medications on file as of 03/17/2016.      REVIEW OF SYSTEMS  : All other systems reviewed and negative except where noted in the History of Present Illness.   PHYSICAL EXAM: BP (!) 152/82  Pulse (!) 108   Ht 5\' 5"  (1.651 m)   Wt 135 lb 8 oz (61.5 kg)   BMI 22.55 kg/m  General: Well developed white female in no acute distress Head: Normocephalic and atraumatic Eyes:  Sclerae anicteric, conjunctiva pink. Ears: Normal auditory acuity Lungs: Clear throughout to auscultation Heart: Regular rate and rhythm Abdomen: Soft, non-distended.  Normal bowel sounds.  Patient expressed tenderness with even barely placing my fingertips on her abdomen and without applying pressure. Musculoskeletal: Symmetrical with no gross deformities  Skin: No lesions on visible extremities Extremities: No edema  Neurological: Alert oriented x 4,  grossly non-focal Psychological:  Alert and cooperative. Normal mood and affect  ASSESSMENT AND PLAN: -76 year old female with history of IBS, fibromyalgia, and interstitial cystitis who presents today after an acute episode of sudden onset nausea, vomiting, right sided abdominal pain, and diarrhea.  CT scan and labs unremarkable.  Symptoms significantly improved but continues with constant nausea, poor appetite, and dull abdominal pain.  I think that she likely had an episode of infectious gastroenteritis. This may be complicated by her functional conditions and possibly flared up into some postinfectious IBS symptoms.  She will be scheduled for endoscopy at her request. Otherwise she will continue daily probiotics and I'm going to give her dicyclomine 10 mg to take twice daily. Continue Zofran as needed.   CC:  Mitchell, L.Marlou Sa, MD

## 2016-03-17 NOTE — Patient Instructions (Addendum)
If you are age 74 or older, your body mass index should be between 23-30. Your Body mass index is 22.55 kg/m. If this is out of the aforementioned range listed, please consider follow up with your Primary Care Provider.  If you are age 12 or younger, your body mass index should be between 19-25. Your Body mass index is 22.55 kg/m. If this is out of the aformentioned range listed, please consider follow up with your Primary Care Provider.   Continue daily probiotics.  Continue Zofran as needed for nausea.  You have been scheduled for an endoscopy. Please follow written instructions given to you at your visit today. If you use inhalers (even only as needed), please bring them with you on the day of your procedure. Your physician has requested that you go to www.startemmi.com and enter the access code given to you at your visit today. This web site gives a general overview about your procedure. However, you should still follow specific instructions given to you by our office regarding your preparation for the procedure.  Thank you for choosing [facility]

## 2016-03-24 NOTE — Progress Notes (Signed)
Reviewed and agree with documentation and assessment and plan. K. Veena Uriyah Raska , MD   

## 2016-03-25 ENCOUNTER — Encounter: Payer: Self-pay | Admitting: Gastroenterology

## 2016-03-25 ENCOUNTER — Ambulatory Visit (AMBULATORY_SURGERY_CENTER): Payer: PPO | Admitting: Gastroenterology

## 2016-03-25 VITALS — BP 117/48 | HR 65 | Temp 98.4°F | Resp 18 | Ht 65.0 in | Wt 135.0 lb

## 2016-03-25 DIAGNOSIS — K219 Gastro-esophageal reflux disease without esophagitis: Secondary | ICD-10-CM | POA: Diagnosis not present

## 2016-03-25 DIAGNOSIS — R110 Nausea: Secondary | ICD-10-CM

## 2016-03-25 DIAGNOSIS — M797 Fibromyalgia: Secondary | ICD-10-CM | POA: Diagnosis not present

## 2016-03-25 DIAGNOSIS — R1013 Epigastric pain: Secondary | ICD-10-CM | POA: Diagnosis not present

## 2016-03-25 MED ORDER — SODIUM CHLORIDE 0.9 % IV SOLN: 500.0000 mL | INTRAVENOUS | Status: DC

## 2016-03-25 NOTE — Progress Notes (Signed)
Pt's states no medical or surgical changes since previsit or office visit. 

## 2016-03-25 NOTE — Op Note (Signed)
[facility] Patient Name: [name] Procedure Date: 03/25/2016 1:20 PM MRN: [id number] Endoscopist: Mauri Pole , MD Age: 76 Referring MD:  Date of Birth: [birth year] Gender: Female Account #: [id number] Procedure:                Upper GI endoscopy Indications:              Epigastric abdominal pain, Nausea Medicines:                Monitored Anesthesia Care Procedure:                Pre-Anesthesia Assessment:                           - Prior to the procedure, a History and Physical                            was performed, and patient medications and                            allergies were reviewed. The patient's tolerance of                            previous anesthesia was also reviewed. The risks                            and benefits of the procedure and the sedation                            options and risks were discussed with the patient.                            All questions were answered, and informed consent                            was obtained. Prior Anticoagulants: The patient has                            taken no previous anticoagulant or antiplatelet                            agents. ASA Grade Assessment: II - A patient with                            mild systemic disease. After reviewing the risks                            and benefits, the patient was deemed in                            satisfactory condition to undergo the procedure.                           After obtaining informed consent, the endoscope was  passed under direct vision. Throughout the                            procedure, the patient's blood pressure, pulse, and                            oxygen saturations were monitored continuously. The                            Model GIF-[id number]) scope was introduced                            through the mouth, and advanced to the second part                            of duodenum. The  upper GI endoscopy was                            accomplished without difficulty. The patient                            tolerated the procedure well. Scope In: Scope Out: Findings:                 The esophagus was normal.                           The stomach was normal.                           The examined duodenum was normal. Complications:            No immediate complications. Estimated Blood Loss:     Estimated blood loss: none. Impression:               - Normal esophagus.                           - Normal stomach.                           - Normal examined duodenum.                           - No specimens collected. Recommendation:           - Patient has a contact number available for                            emergencies. The signs and symptoms of potential                            delayed complications were discussed with the                            patient. Return to normal activities tomorrow.  Written discharge instructions were provided to the                            patient.                           - Resume previous diet.                           - Small frequent meals                           - Continue present medications.                           - FD gaurd 1 capsule Three times daily as needed                           - No repeat upper endoscopy.                           - Return to GI office PRN. Mauri Pole, MD 03/25/2016 1:50:04 PM This report has been signed electronically.

## 2016-03-25 NOTE — Progress Notes (Signed)
Report to PACU, RN, vss, BBS= Clear.  

## 2016-03-25 NOTE — Patient Instructions (Signed)
YOU HAD AN ENDOSCOPIC PROCEDURE TODAY AT [facility]:   Refer to the procedure report that was given to you for any specific questions about what was found during the examination.  If the procedure report does not answer your questions, please call your gastroenterologist to clarify.  If you requested that your care partner not be given the details of your procedure findings, then the procedure report has been included in a sealed envelope for you to review at your convenience later.  YOU SHOULD EXPECT: Some feelings of bloating in the abdomen. Passage of more gas than usual.  Walking can help get rid of the air that was put into your GI tract during the procedure and reduce the bloating.   Please Note:  You might notice some irritation and congestion in your nose or some drainage.  This is from the oxygen used during your procedure.  There is no need for concern and it should clear up in a day or so.  SYMPTOMS TO REPORT IMMEDIATELY:    Following upper endoscopy (EGD)  Vomiting of blood or coffee ground material  New chest pain or pain under the shoulder blades  Painful or persistently difficult swallowing  New shortness of breath  Fever of 100F or higher  Black, tarry-looking stools  For urgent or emergent issues, a gastroenterologist can be reached at any hour by calling [phone].   DIET:  We do recommend a small frequent meals at first, but then you may proceed to your regular diet.  Drink plenty of fluids but you should avoid alcoholic beverages for 24 hours.  ACTIVITY:  You should plan to take it easy for the rest of today and you should NOT DRIVE or use heavy machinery until tomorrow (because of the sedation medicines used during the test).    FOLLOW UP: Our staff will call the number listed on your records the next business day following your procedure to check on you and address any questions or concerns that you may have regarding the information given to  you following your procedure. If we do not reach you, we will leave a message.  However, if you are feeling well and you are not experiencing any problems, there is no need to return our call.  We will assume that you have returned to your regular daily activities without incident.  If any biopsies were taken you will be contacted by phone or by letter within the next 1-3 weeks.  Please call us at [phone] if you have not heard about the biopsies in 3 weeks.    SIGNATURES/CONFIDENTIALITY: You and/or your care partner have signed paperwork which will be entered into your electronic medical record.  These signatures attest to the fact that that the information above on your After Visit Summary has been reviewed and is understood.  Full responsibility of the confidentiality of this discharge information lies with you and/or your care-partner.  Take the FD Guard three times per day as needed per Dr. Silverio Decamp.  Read all handouts given to you by your recovery room nurse. Take your protonix for at least two months per Dr. Silverio Decamp.

## 2016-03-26 ENCOUNTER — Telehealth: Payer: Self-pay

## 2016-03-26 NOTE — Telephone Encounter (Signed)
  Follow up Call-  Call back number 03/25/2016 11/29/2013  Post procedure Call Back phone  # [phone]  Permission to leave phone message Yes Yes  Some recent data might be hidden     Patient questions:  Do you have a fever, pain , or abdominal swelling? No. Pain Score  0 *  Have you tolerated food without any problems? Yes.    Have you been able to return to your normal activities? Yes.    Do you have any questions about your discharge instructions: Diet   No. Medications  No. Follow up visit  No.  Do you have questions or concerns about your Care? No.  Actions: * If pain score is 4 or above: No action needed, pain <4.

## 2016-04-16 DIAGNOSIS — F411 Generalized anxiety disorder: Secondary | ICD-10-CM | POA: Diagnosis not present

## 2016-04-16 DIAGNOSIS — E7800 Pure hypercholesterolemia, unspecified: Secondary | ICD-10-CM | POA: Diagnosis not present

## 2016-04-16 DIAGNOSIS — K589 Irritable bowel syndrome without diarrhea: Secondary | ICD-10-CM | POA: Diagnosis not present

## 2016-04-16 DIAGNOSIS — I471 Supraventricular tachycardia: Secondary | ICD-10-CM | POA: Diagnosis not present

## 2016-04-16 DIAGNOSIS — I10 Essential (primary) hypertension: Secondary | ICD-10-CM | POA: Diagnosis not present

## 2016-06-09 DIAGNOSIS — R3914 Feeling of incomplete bladder emptying: Secondary | ICD-10-CM | POA: Diagnosis not present

## 2016-07-25 ENCOUNTER — Other Ambulatory Visit: Payer: Self-pay | Admitting: Family Medicine

## 2016-07-25 DIAGNOSIS — R109 Unspecified abdominal pain: Secondary | ICD-10-CM

## 2016-07-25 DIAGNOSIS — R110 Nausea: Secondary | ICD-10-CM

## 2016-08-06 ENCOUNTER — Ambulatory Visit
Admission: RE | Admit: 2016-08-06 | Discharge: 2016-08-06 | Disposition: A | Discharge status: Home / Self Care | Payer: PPO | Source: Ambulatory Visit | Attending: Family Medicine | Admitting: Family Medicine

## 2016-08-06 ENCOUNTER — Telehealth: Payer: Self-pay | Admitting: *Deleted

## 2016-08-06 DIAGNOSIS — R110 Nausea: Secondary | ICD-10-CM

## 2016-08-06 DIAGNOSIS — R109 Unspecified abdominal pain: Secondary | ICD-10-CM

## 2016-08-06 DIAGNOSIS — N281 Cyst of kidney, acquired: Secondary | ICD-10-CM | POA: Diagnosis not present

## 2016-08-06 MED ORDER — PANTOPRAZOLE SODIUM 40 MG PO TBEC: 40.0000 mg | DELAYED_RELEASE_TABLET | Freq: Every day | ORAL | 3 refills | Status: DC

## 2016-08-06 NOTE — Telephone Encounter (Signed)
90 day supply protonix sent to patients pharmacy per fax request

## 2016-08-07 ENCOUNTER — Other Ambulatory Visit: Payer: PPO

## 2016-08-28 DIAGNOSIS — N281 Cyst of kidney, acquired: Secondary | ICD-10-CM | POA: Diagnosis not present

## 2016-08-28 DIAGNOSIS — Z9889 Other specified postprocedural states: Secondary | ICD-10-CM | POA: Diagnosis not present

## 2016-08-28 DIAGNOSIS — Z87448 Personal history of other diseases of urinary system: Secondary | ICD-10-CM | POA: Diagnosis not present

## 2016-08-28 DIAGNOSIS — N390 Urinary tract infection, site not specified: Secondary | ICD-10-CM | POA: Diagnosis not present

## 2016-08-28 DIAGNOSIS — T83511S Infection and inflammatory reaction due to indwelling urethral catheter, sequela: Secondary | ICD-10-CM | POA: Diagnosis not present

## 2016-09-09 DIAGNOSIS — R3914 Feeling of incomplete bladder emptying: Secondary | ICD-10-CM | POA: Diagnosis not present

## 2016-09-25 DIAGNOSIS — Z803 Family history of malignant neoplasm of breast: Secondary | ICD-10-CM | POA: Diagnosis not present

## 2016-09-25 DIAGNOSIS — Z1231 Encounter for screening mammogram for malignant neoplasm of breast: Secondary | ICD-10-CM | POA: Diagnosis not present

## 2016-10-14 ENCOUNTER — Other Ambulatory Visit: Payer: Self-pay | Admitting: Family Medicine

## 2016-10-14 DIAGNOSIS — I10 Essential (primary) hypertension: Secondary | ICD-10-CM | POA: Diagnosis not present

## 2016-10-14 DIAGNOSIS — K5900 Constipation, unspecified: Secondary | ICD-10-CM | POA: Diagnosis not present

## 2016-10-14 DIAGNOSIS — K589 Irritable bowel syndrome without diarrhea: Secondary | ICD-10-CM | POA: Diagnosis not present

## 2016-10-14 DIAGNOSIS — R1084 Generalized abdominal pain: Secondary | ICD-10-CM

## 2016-10-14 DIAGNOSIS — F411 Generalized anxiety disorder: Secondary | ICD-10-CM | POA: Diagnosis not present

## 2016-10-14 DIAGNOSIS — R109 Unspecified abdominal pain: Secondary | ICD-10-CM | POA: Diagnosis not present

## 2016-10-17 ENCOUNTER — Ambulatory Visit
Admission: RE | Admit: 2016-10-17 | Discharge: 2016-10-17 | Disposition: A | Discharge status: Home / Self Care | Payer: PPO | Source: Ambulatory Visit | Attending: Family Medicine | Admitting: Family Medicine

## 2016-10-17 DIAGNOSIS — R1030 Lower abdominal pain, unspecified: Secondary | ICD-10-CM | POA: Diagnosis not present

## 2016-10-17 DIAGNOSIS — R1084 Generalized abdominal pain: Secondary | ICD-10-CM

## 2016-10-17 MED ORDER — IOPAMIDOL (ISOVUE-300) INJECTION 61%
100.0000 mL | Freq: Once | INTRAVENOUS | Status: AC | PRN
Start: 2016-10-17 — End: 2016-10-17
  Administered 2016-10-17: 100 mL via INTRAVENOUS

## 2016-11-19 DIAGNOSIS — H524 Presbyopia: Secondary | ICD-10-CM | POA: Diagnosis not present

## 2016-11-19 DIAGNOSIS — H5213 Myopia, bilateral: Secondary | ICD-10-CM | POA: Diagnosis not present

## 2016-11-28 DIAGNOSIS — R3914 Feeling of incomplete bladder emptying: Secondary | ICD-10-CM | POA: Diagnosis not present

## 2016-12-26 DIAGNOSIS — R509 Fever, unspecified: Secondary | ICD-10-CM | POA: Diagnosis not present

## 2016-12-26 DIAGNOSIS — J019 Acute sinusitis, unspecified: Secondary | ICD-10-CM | POA: Diagnosis not present

## 2017-01-12 DIAGNOSIS — B349 Viral infection, unspecified: Secondary | ICD-10-CM | POA: Diagnosis not present

## 2017-02-02 DIAGNOSIS — M25561 Pain in right knee: Secondary | ICD-10-CM | POA: Diagnosis not present

## 2017-02-02 DIAGNOSIS — M13861 Other specified arthritis, right knee: Secondary | ICD-10-CM | POA: Diagnosis not present

## 2017-02-02 DIAGNOSIS — M1711 Unilateral primary osteoarthritis, right knee: Secondary | ICD-10-CM

## 2017-02-02 HISTORY — DX: Unilateral primary osteoarthritis, right knee: M17.11

## 2017-02-02 HISTORY — DX: Pain in right knee: M25.561

## 2017-02-12 DIAGNOSIS — N952 Postmenopausal atrophic vaginitis: Secondary | ICD-10-CM | POA: Diagnosis not present

## 2017-02-12 DIAGNOSIS — Z87440 Personal history of urinary (tract) infections: Secondary | ICD-10-CM | POA: Diagnosis not present

## 2017-02-12 DIAGNOSIS — Z9889 Other specified postprocedural states: Secondary | ICD-10-CM | POA: Diagnosis not present

## 2017-02-12 DIAGNOSIS — R109 Unspecified abdominal pain: Secondary | ICD-10-CM | POA: Diagnosis not present

## 2017-02-20 DIAGNOSIS — R3914 Feeling of incomplete bladder emptying: Secondary | ICD-10-CM | POA: Diagnosis not present

## 2017-02-26 DIAGNOSIS — M25561 Pain in right knee: Secondary | ICD-10-CM | POA: Diagnosis not present

## 2017-02-26 DIAGNOSIS — M13861 Other specified arthritis, right knee: Secondary | ICD-10-CM | POA: Diagnosis not present

## 2017-03-06 DIAGNOSIS — M25561 Pain in right knee: Secondary | ICD-10-CM | POA: Diagnosis not present

## 2017-03-18 DIAGNOSIS — M13861 Other specified arthritis, right knee: Secondary | ICD-10-CM | POA: Diagnosis not present

## 2017-03-18 DIAGNOSIS — M25561 Pain in right knee: Secondary | ICD-10-CM | POA: Diagnosis not present

## 2017-04-14 DIAGNOSIS — E7800 Pure hypercholesterolemia, unspecified: Secondary | ICD-10-CM | POA: Diagnosis not present

## 2017-04-14 DIAGNOSIS — F411 Generalized anxiety disorder: Secondary | ICD-10-CM | POA: Diagnosis not present

## 2017-04-14 DIAGNOSIS — Z23 Encounter for immunization: Secondary | ICD-10-CM | POA: Diagnosis not present

## 2017-04-14 DIAGNOSIS — I10 Essential (primary) hypertension: Secondary | ICD-10-CM | POA: Diagnosis not present

## 2017-04-14 DIAGNOSIS — K589 Irritable bowel syndrome without diarrhea: Secondary | ICD-10-CM | POA: Diagnosis not present

## 2017-05-14 DIAGNOSIS — M25561 Pain in right knee: Secondary | ICD-10-CM | POA: Diagnosis not present

## 2017-05-14 DIAGNOSIS — M13861 Other specified arthritis, right knee: Secondary | ICD-10-CM | POA: Diagnosis not present

## 2017-05-21 DIAGNOSIS — R3914 Feeling of incomplete bladder emptying: Secondary | ICD-10-CM | POA: Diagnosis not present

## 2017-06-10 DIAGNOSIS — B961 Klebsiella pneumoniae [K. pneumoniae] as the cause of diseases classified elsewhere: Secondary | ICD-10-CM | POA: Diagnosis not present

## 2017-06-10 DIAGNOSIS — R399 Unspecified symptoms and signs involving the genitourinary system: Secondary | ICD-10-CM | POA: Diagnosis not present

## 2017-06-10 DIAGNOSIS — N390 Urinary tract infection, site not specified: Secondary | ICD-10-CM | POA: Diagnosis not present

## 2017-08-10 DIAGNOSIS — R3914 Feeling of incomplete bladder emptying: Secondary | ICD-10-CM | POA: Diagnosis not present

## 2017-08-13 DIAGNOSIS — Z906 Acquired absence of other parts of urinary tract: Secondary | ICD-10-CM | POA: Diagnosis not present

## 2017-08-13 DIAGNOSIS — R82998 Other abnormal findings in urine: Secondary | ICD-10-CM | POA: Diagnosis not present

## 2017-08-13 DIAGNOSIS — Z87440 Personal history of urinary (tract) infections: Secondary | ICD-10-CM | POA: Diagnosis not present

## 2017-08-13 DIAGNOSIS — Z09 Encounter for follow-up examination after completed treatment for conditions other than malignant neoplasm: Secondary | ICD-10-CM | POA: Diagnosis not present

## 2017-08-13 DIAGNOSIS — B964 Proteus (mirabilis) (morganii) as the cause of diseases classified elsewhere: Secondary | ICD-10-CM | POA: Diagnosis not present

## 2017-08-26 DIAGNOSIS — M13861 Other specified arthritis, right knee: Secondary | ICD-10-CM | POA: Diagnosis not present

## 2017-08-26 DIAGNOSIS — M25561 Pain in right knee: Secondary | ICD-10-CM | POA: Diagnosis not present

## 2017-09-14 DIAGNOSIS — N281 Cyst of kidney, acquired: Secondary | ICD-10-CM | POA: Diagnosis not present

## 2017-09-14 DIAGNOSIS — N200 Calculus of kidney: Secondary | ICD-10-CM | POA: Diagnosis not present

## 2017-09-29 DIAGNOSIS — Z1231 Encounter for screening mammogram for malignant neoplasm of breast: Secondary | ICD-10-CM | POA: Diagnosis not present

## 2017-09-29 DIAGNOSIS — Z803 Family history of malignant neoplasm of breast: Secondary | ICD-10-CM | POA: Diagnosis not present

## 2017-10-15 DIAGNOSIS — K589 Irritable bowel syndrome without diarrhea: Secondary | ICD-10-CM | POA: Diagnosis not present

## 2017-10-15 DIAGNOSIS — I10 Essential (primary) hypertension: Secondary | ICD-10-CM | POA: Diagnosis not present

## 2017-10-15 DIAGNOSIS — F411 Generalized anxiety disorder: Secondary | ICD-10-CM | POA: Diagnosis not present

## 2017-10-15 DIAGNOSIS — E782 Mixed hyperlipidemia: Secondary | ICD-10-CM | POA: Diagnosis not present

## 2017-11-11 DIAGNOSIS — M13861 Other specified arthritis, right knee: Secondary | ICD-10-CM | POA: Diagnosis not present

## 2017-11-11 DIAGNOSIS — M25562 Pain in left knee: Secondary | ICD-10-CM

## 2017-11-11 HISTORY — DX: Pain in left knee: M25.562

## 2017-11-23 DIAGNOSIS — H5213 Myopia, bilateral: Secondary | ICD-10-CM | POA: Diagnosis not present

## 2017-11-23 DIAGNOSIS — H524 Presbyopia: Secondary | ICD-10-CM | POA: Diagnosis not present

## 2017-11-23 DIAGNOSIS — H52203 Unspecified astigmatism, bilateral: Secondary | ICD-10-CM | POA: Diagnosis not present

## 2017-11-23 DIAGNOSIS — Z961 Presence of intraocular lens: Secondary | ICD-10-CM | POA: Diagnosis not present

## 2017-12-30 DIAGNOSIS — R3914 Feeling of incomplete bladder emptying: Secondary | ICD-10-CM | POA: Diagnosis not present

## 2018-01-04 DIAGNOSIS — R3914 Feeling of incomplete bladder emptying: Secondary | ICD-10-CM | POA: Diagnosis not present

## 2018-04-15 DIAGNOSIS — D485 Neoplasm of uncertain behavior of skin: Secondary | ICD-10-CM | POA: Diagnosis not present

## 2018-04-15 DIAGNOSIS — R002 Palpitations: Secondary | ICD-10-CM | POA: Diagnosis not present

## 2018-04-15 DIAGNOSIS — E782 Mixed hyperlipidemia: Secondary | ICD-10-CM | POA: Diagnosis not present

## 2018-04-15 DIAGNOSIS — K589 Irritable bowel syndrome without diarrhea: Secondary | ICD-10-CM | POA: Diagnosis not present

## 2018-04-15 DIAGNOSIS — F411 Generalized anxiety disorder: Secondary | ICD-10-CM | POA: Diagnosis not present

## 2018-04-15 DIAGNOSIS — D489 Neoplasm of uncertain behavior, unspecified: Secondary | ICD-10-CM | POA: Diagnosis not present

## 2018-04-15 DIAGNOSIS — I10 Essential (primary) hypertension: Secondary | ICD-10-CM | POA: Diagnosis not present

## 2018-05-31 DIAGNOSIS — R3914 Feeling of incomplete bladder emptying: Secondary | ICD-10-CM | POA: Diagnosis not present

## 2018-08-26 DIAGNOSIS — R399 Unspecified symptoms and signs involving the genitourinary system: Secondary | ICD-10-CM | POA: Diagnosis not present

## 2018-09-22 DIAGNOSIS — R3914 Feeling of incomplete bladder emptying: Secondary | ICD-10-CM | POA: Diagnosis not present

## 2018-10-05 DIAGNOSIS — Z803 Family history of malignant neoplasm of breast: Secondary | ICD-10-CM | POA: Diagnosis not present

## 2018-10-05 DIAGNOSIS — Z1231 Encounter for screening mammogram for malignant neoplasm of breast: Secondary | ICD-10-CM | POA: Diagnosis not present

## 2018-10-19 DIAGNOSIS — F411 Generalized anxiety disorder: Secondary | ICD-10-CM | POA: Diagnosis not present

## 2018-10-19 DIAGNOSIS — E782 Mixed hyperlipidemia: Secondary | ICD-10-CM | POA: Diagnosis not present

## 2018-10-19 DIAGNOSIS — K589 Irritable bowel syndrome without diarrhea: Secondary | ICD-10-CM | POA: Diagnosis not present

## 2018-10-19 DIAGNOSIS — I10 Essential (primary) hypertension: Secondary | ICD-10-CM | POA: Diagnosis not present

## 2018-11-02 DIAGNOSIS — N200 Calculus of kidney: Secondary | ICD-10-CM | POA: Diagnosis not present

## 2018-11-02 DIAGNOSIS — Z87442 Personal history of urinary calculi: Secondary | ICD-10-CM | POA: Diagnosis not present

## 2018-11-02 DIAGNOSIS — Z906 Acquired absence of other parts of urinary tract: Secondary | ICD-10-CM | POA: Diagnosis not present

## 2018-11-02 DIAGNOSIS — N281 Cyst of kidney, acquired: Secondary | ICD-10-CM | POA: Diagnosis not present

## 2018-11-17 DIAGNOSIS — Z906 Acquired absence of other parts of urinary tract: Secondary | ICD-10-CM | POA: Diagnosis not present

## 2018-11-17 DIAGNOSIS — Z87442 Personal history of urinary calculi: Secondary | ICD-10-CM | POA: Diagnosis not present

## 2018-11-17 DIAGNOSIS — Z9889 Other specified postprocedural states: Secondary | ICD-10-CM | POA: Diagnosis not present

## 2018-11-17 DIAGNOSIS — N281 Cyst of kidney, acquired: Secondary | ICD-10-CM | POA: Diagnosis not present

## 2018-11-26 DIAGNOSIS — H52203 Unspecified astigmatism, bilateral: Secondary | ICD-10-CM | POA: Diagnosis not present

## 2018-11-26 DIAGNOSIS — Z961 Presence of intraocular lens: Secondary | ICD-10-CM | POA: Diagnosis not present

## 2018-11-26 DIAGNOSIS — H524 Presbyopia: Secondary | ICD-10-CM | POA: Diagnosis not present

## 2018-12-08 DIAGNOSIS — R3914 Feeling of incomplete bladder emptying: Secondary | ICD-10-CM | POA: Diagnosis not present

## 2019-01-18 DIAGNOSIS — R3914 Feeling of incomplete bladder emptying: Secondary | ICD-10-CM | POA: Diagnosis not present

## 2019-02-17 ENCOUNTER — Ambulatory Visit: Payer: PPO | Attending: Internal Medicine

## 2019-02-17 DIAGNOSIS — Z23 Encounter for immunization: Secondary | ICD-10-CM | POA: Insufficient documentation

## 2019-02-17 NOTE — Progress Notes (Signed)
   Covid-19 Vaccination Clinic  Name:  [name]    MRN: [id number] DOB: 01-01-1941  02/17/2019  [name] was observed post Covid-19 immunization for 15 minutes without incidence. She was provided with Vaccine Information Sheet and instruction to access the V-Safe system.   [name] was instructed to call 911 with any severe reactions post vaccine: Marland Kitchen Difficulty breathing  . Swelling of your face and throat  . A fast heartbeat  . A bad rash all over your body  . Dizziness and weakness    Immunizations Administered    Name Date Dose VIS Date Route   Pfizer COVID-19 Vaccine 02/17/2019  3:51 PM 0.3 mL 01/07/2019 Intramuscular   Manufacturer: [place]   Lot: [id number]   [place]: [id number]

## 2019-02-28 DIAGNOSIS — R3914 Feeling of incomplete bladder emptying: Secondary | ICD-10-CM | POA: Diagnosis not present

## 2019-03-11 ENCOUNTER — Ambulatory Visit: Payer: PPO | Attending: Internal Medicine

## 2019-03-11 DIAGNOSIS — Z23 Encounter for immunization: Secondary | ICD-10-CM | POA: Insufficient documentation

## 2019-03-11 NOTE — Progress Notes (Signed)
   Covid-19 Vaccination Clinic  Name:  [name]    MRN: [id number] DOB: 02/01/1940  03/11/2019  [name] was observed post Covid-19 immunization for 15 minutes without incidence. She was provided with Vaccine Information Sheet and instruction to access the V-Safe system.   [name] was instructed to call 911 with any severe reactions post vaccine: Marland Kitchen Difficulty breathing  . Swelling of your face and throat  . A fast heartbeat  . A bad rash all over your body  . Dizziness and weakness    Immunizations Administered    Name Date Dose VIS Date Route   Pfizer COVID-19 Vaccine 03/11/2019  9:19 AM 0.3 mL 01/07/2019 Intramuscular   Manufacturer: [place]   Lot: [id number]   [place]: [id number]

## 2019-03-29 DIAGNOSIS — R3914 Feeling of incomplete bladder emptying: Secondary | ICD-10-CM | POA: Diagnosis not present

## 2019-04-18 IMAGING — US US ABDOMEN COMPLETE
1 series · 14 of 25 positions shown · non-contrast
Comparison: Body CT 02/28/2016

CLINICAL DATA: Abdominal pain and nausea for 2 months.

EXAM:
ABDOMEN ULTRASOUND COMPLETE

[Series 1: us abdomen complete · 0.12mm/px · 14 of 98 slices shown]
[im 1/98]
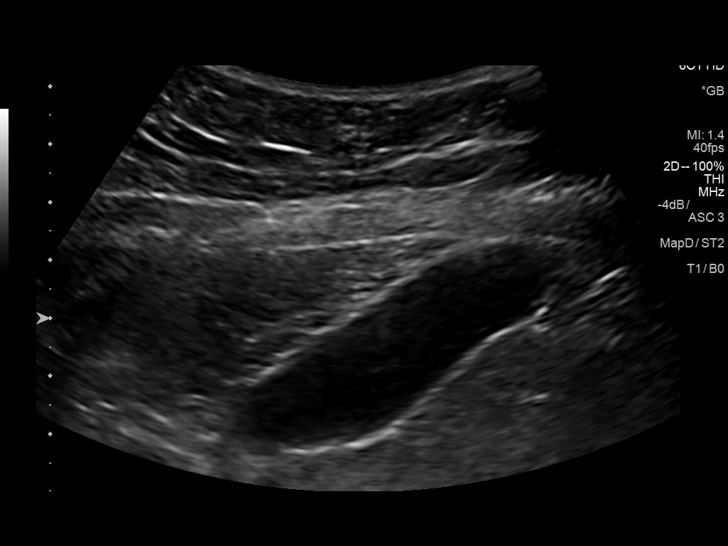
[im 9/98]
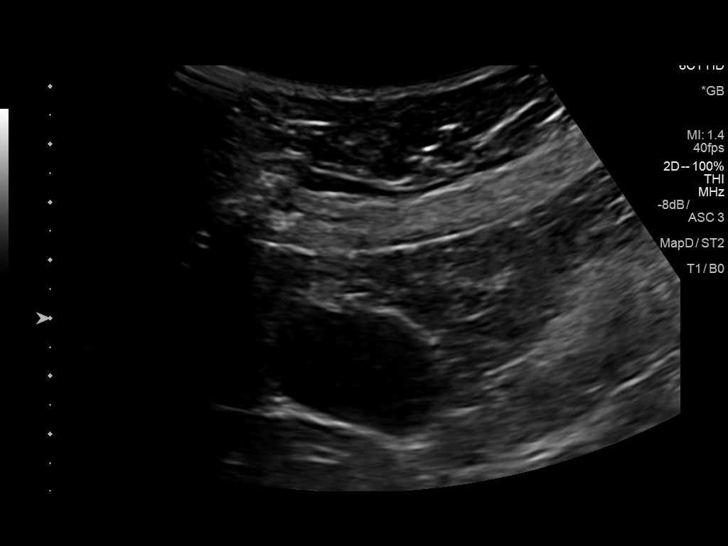
[im 17/98]
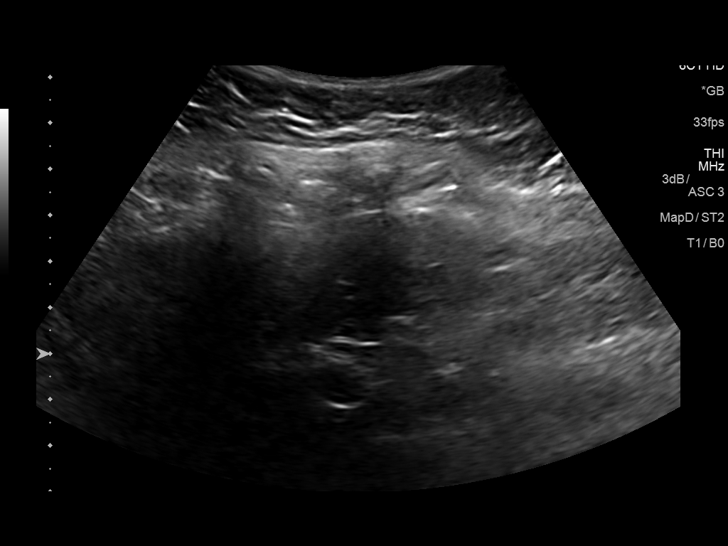
[im 25/98]
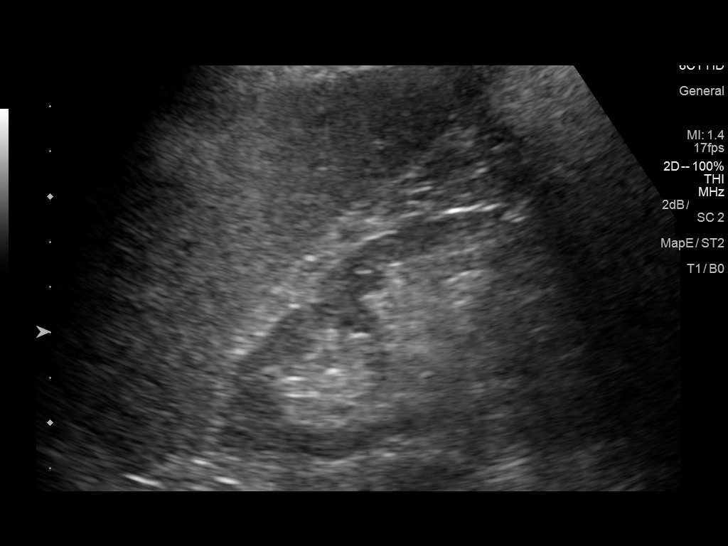
[im 33/98]
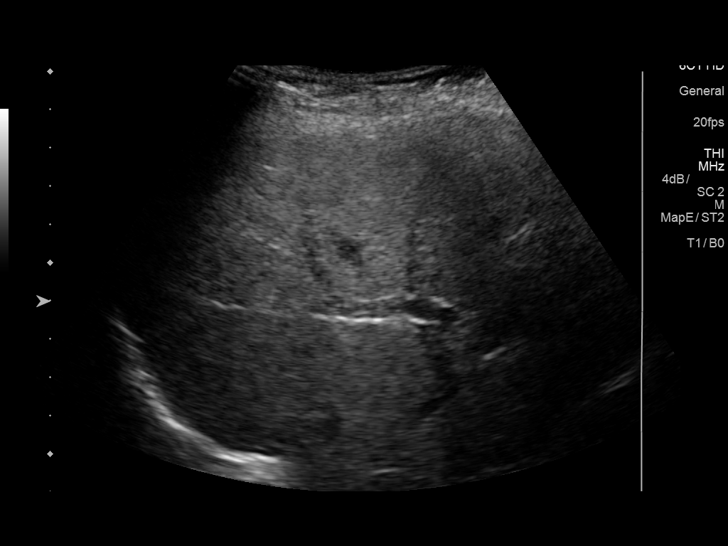
[im 37/98]
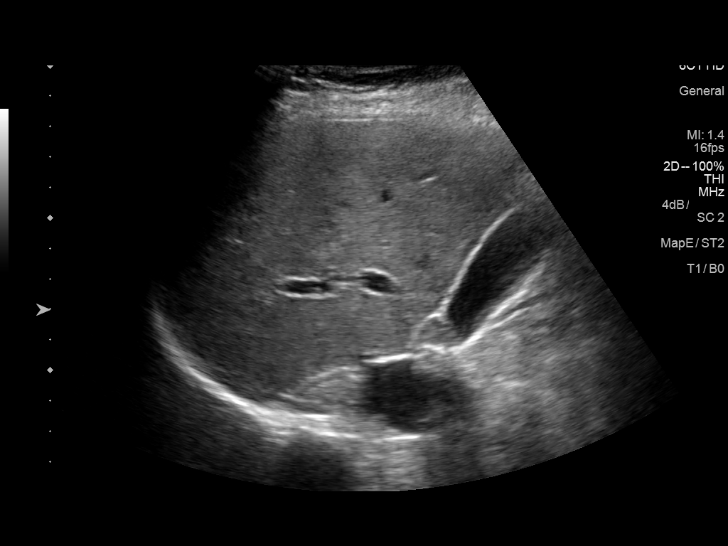
[im 45/98]
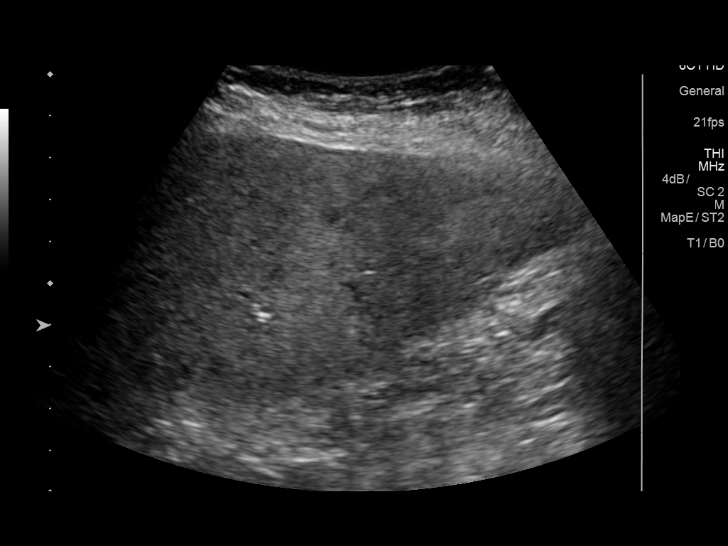
[im 53/98]
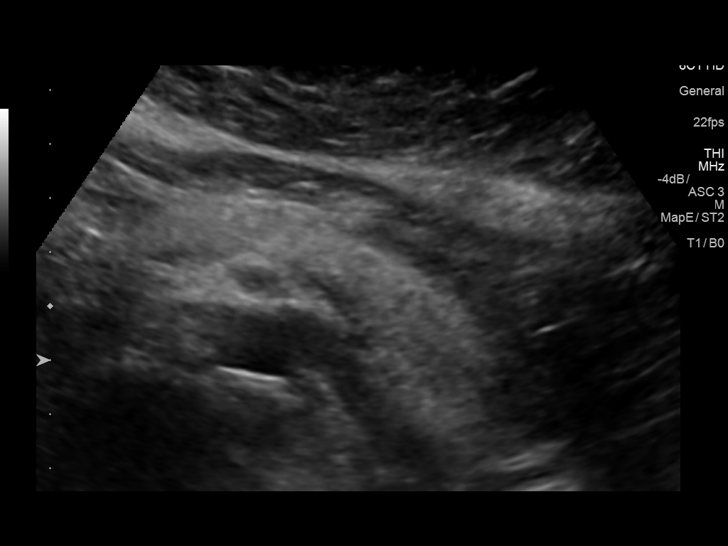
[im 61/98]
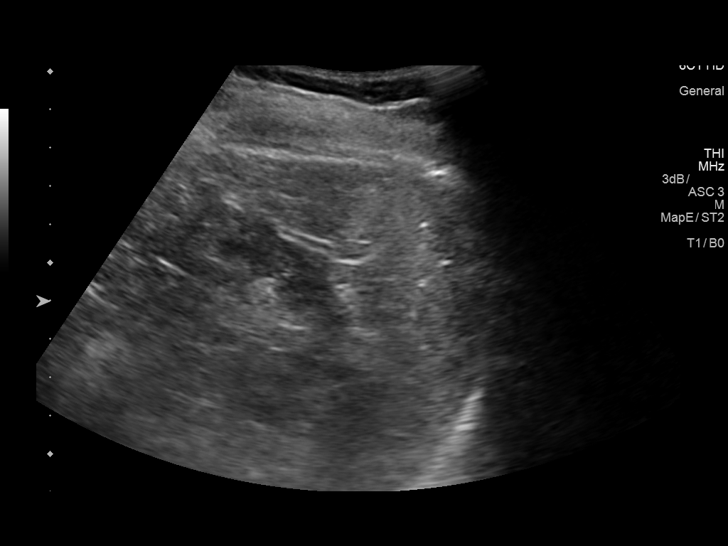
[im 65/98]
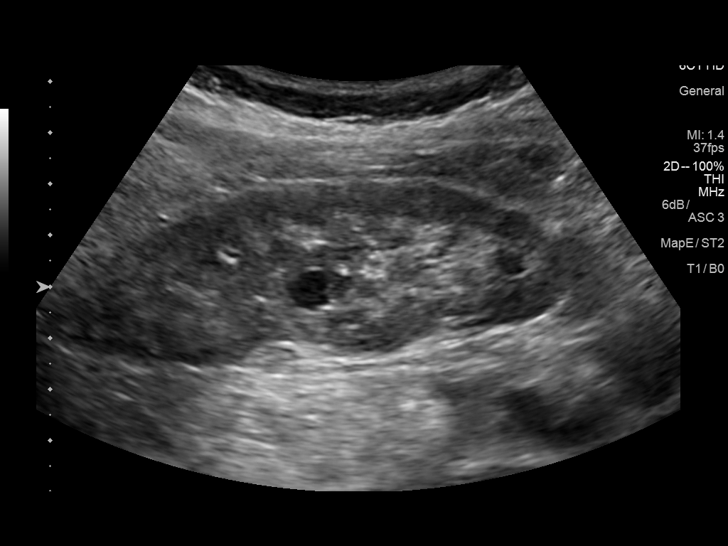
[im 73/98]
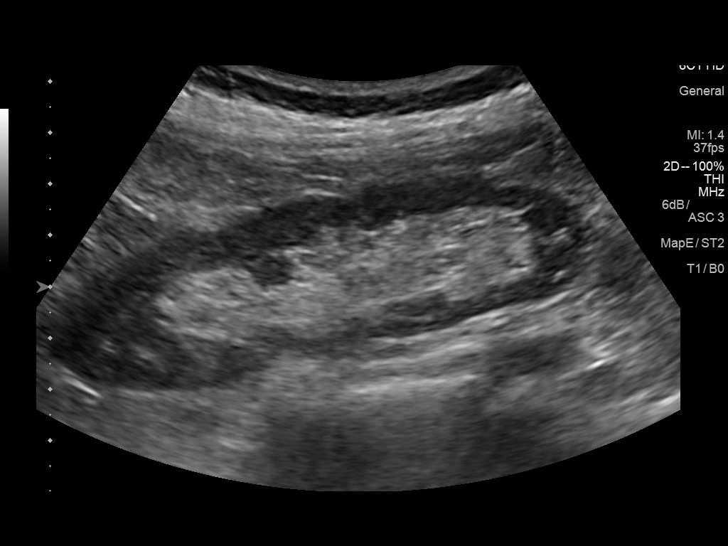
[im 81/98]
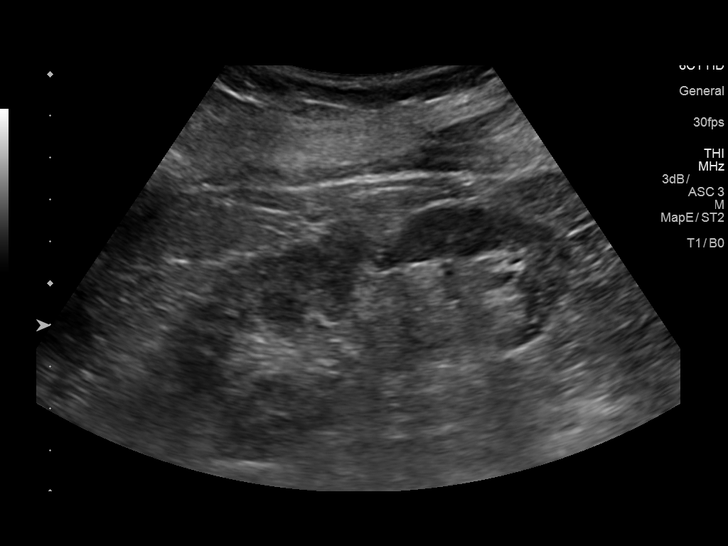
[im 89/98]
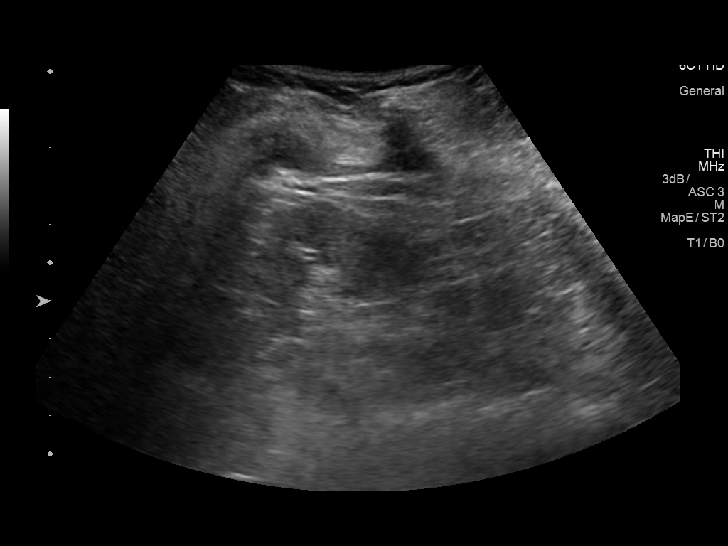
[im 98/98]
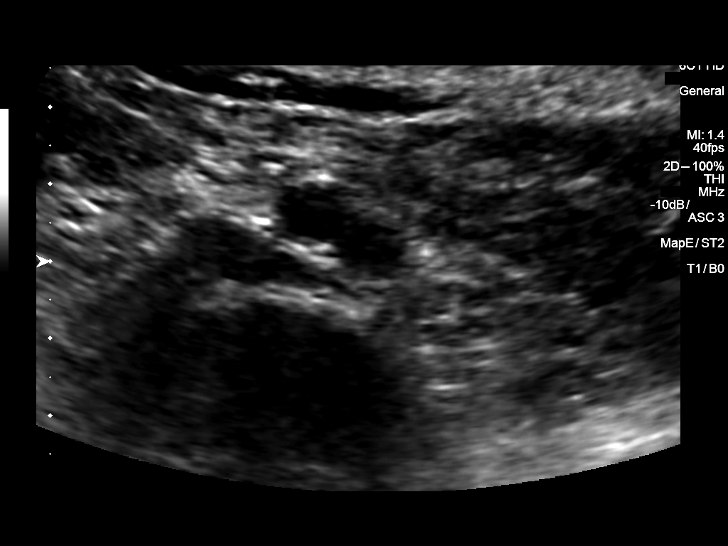

[14 of 25 positions shown; findings below may reference images not displayed]

FINDINGS: Gallbladder: No gallstones or wall thickening visualized. No
sonographic Murphy sign noted by sonographer.

Common bile duct: Diameter: 3.6 mm

Liver: No focal lesion identified. Within normal limits in
parenchymal echogenicity.

IVC: No abnormality visualized.

Pancreas: Visualized portion unremarkable.

Spleen: Size and appearance within normal limits.

Right Kidney: Length: 10.6 cm. Cortical thinning of the right kidney
is noted. There is a 0.8 x 0.7 x 1.0 cm benign-appearing cyst in the
midpole of the right kidney.

Left Kidney: Length: 10.8 cm. Echogenicity within normal limits.
There is a benign-appearing cyst in the mid to lower pole of the
left kidney measuring 1.2 x 0.9 x 0.8 cm.

Abdominal aorta: No aneurysm visualized. Atherosclerotic plaque
noted.

Other findings: None.
IMPRESSION: No evidence of acute abnormalities in the abdomen sonographically.

Mild right renal cortical thinning.

Bilateral benign-appearing renal cysts.

Aortic atherosclerosis.

## 2019-04-19 DIAGNOSIS — F411 Generalized anxiety disorder: Secondary | ICD-10-CM | POA: Diagnosis not present

## 2019-04-19 DIAGNOSIS — Z0000 Encounter for general adult medical examination without abnormal findings: Secondary | ICD-10-CM | POA: Diagnosis not present

## 2019-04-19 DIAGNOSIS — J309 Allergic rhinitis, unspecified: Secondary | ICD-10-CM | POA: Diagnosis not present

## 2019-04-19 DIAGNOSIS — I10 Essential (primary) hypertension: Secondary | ICD-10-CM | POA: Diagnosis not present

## 2019-04-19 DIAGNOSIS — E7800 Pure hypercholesterolemia, unspecified: Secondary | ICD-10-CM | POA: Diagnosis not present

## 2019-04-29 DIAGNOSIS — R3914 Feeling of incomplete bladder emptying: Secondary | ICD-10-CM | POA: Diagnosis not present

## 2019-05-09 DIAGNOSIS — R399 Unspecified symptoms and signs involving the genitourinary system: Secondary | ICD-10-CM | POA: Diagnosis not present

## 2019-05-25 DIAGNOSIS — R3914 Feeling of incomplete bladder emptying: Secondary | ICD-10-CM | POA: Diagnosis not present

## 2019-06-03 DIAGNOSIS — R413 Other amnesia: Secondary | ICD-10-CM | POA: Diagnosis not present

## 2019-06-03 DIAGNOSIS — F411 Generalized anxiety disorder: Secondary | ICD-10-CM | POA: Diagnosis not present

## 2019-06-07 DIAGNOSIS — R399 Unspecified symptoms and signs involving the genitourinary system: Secondary | ICD-10-CM | POA: Diagnosis not present

## 2019-06-16 DIAGNOSIS — R3914 Feeling of incomplete bladder emptying: Secondary | ICD-10-CM | POA: Diagnosis not present

## 2019-07-21 DIAGNOSIS — Z9889 Other specified postprocedural states: Secondary | ICD-10-CM

## 2019-07-21 DIAGNOSIS — R339 Retention of urine, unspecified: Secondary | ICD-10-CM | POA: Diagnosis not present

## 2019-07-21 DIAGNOSIS — Z906 Acquired absence of other parts of urinary tract: Secondary | ICD-10-CM | POA: Diagnosis not present

## 2019-07-21 HISTORY — DX: Other specified postprocedural states: Z98.890

## 2019-08-12 DIAGNOSIS — R3914 Feeling of incomplete bladder emptying: Secondary | ICD-10-CM | POA: Diagnosis not present

## 2019-09-09 DIAGNOSIS — R3914 Feeling of incomplete bladder emptying: Secondary | ICD-10-CM | POA: Diagnosis not present

## 2019-10-10 DIAGNOSIS — R3914 Feeling of incomplete bladder emptying: Secondary | ICD-10-CM | POA: Diagnosis not present

## 2019-10-20 DIAGNOSIS — R002 Palpitations: Secondary | ICD-10-CM | POA: Diagnosis not present

## 2019-10-20 DIAGNOSIS — R413 Other amnesia: Secondary | ICD-10-CM | POA: Diagnosis not present

## 2019-10-20 DIAGNOSIS — F411 Generalized anxiety disorder: Secondary | ICD-10-CM | POA: Diagnosis not present

## 2019-10-20 DIAGNOSIS — E782 Mixed hyperlipidemia: Secondary | ICD-10-CM | POA: Diagnosis not present

## 2019-10-20 DIAGNOSIS — I10 Essential (primary) hypertension: Secondary | ICD-10-CM | POA: Diagnosis not present

## 2019-10-20 DIAGNOSIS — Z23 Encounter for immunization: Secondary | ICD-10-CM | POA: Diagnosis not present

## 2019-10-20 DIAGNOSIS — G4700 Insomnia, unspecified: Secondary | ICD-10-CM | POA: Diagnosis not present

## 2019-10-31 DIAGNOSIS — Z1231 Encounter for screening mammogram for malignant neoplasm of breast: Secondary | ICD-10-CM | POA: Diagnosis not present

## 2019-10-31 DIAGNOSIS — Z803 Family history of malignant neoplasm of breast: Secondary | ICD-10-CM | POA: Diagnosis not present

## 2019-11-01 DIAGNOSIS — R3914 Feeling of incomplete bladder emptying: Secondary | ICD-10-CM | POA: Diagnosis not present

## 2019-11-03 DIAGNOSIS — Z906 Acquired absence of other parts of urinary tract: Secondary | ICD-10-CM | POA: Diagnosis not present

## 2019-11-03 DIAGNOSIS — Z87442 Personal history of urinary calculi: Secondary | ICD-10-CM | POA: Diagnosis not present

## 2019-12-01 DIAGNOSIS — H524 Presbyopia: Secondary | ICD-10-CM | POA: Diagnosis not present

## 2019-12-01 DIAGNOSIS — R3914 Feeling of incomplete bladder emptying: Secondary | ICD-10-CM | POA: Diagnosis not present

## 2019-12-01 DIAGNOSIS — H52203 Unspecified astigmatism, bilateral: Secondary | ICD-10-CM | POA: Diagnosis not present

## 2019-12-01 DIAGNOSIS — Z961 Presence of intraocular lens: Secondary | ICD-10-CM | POA: Diagnosis not present

## 2019-12-10 ENCOUNTER — Ambulatory Visit: Payer: PPO

## 2019-12-30 DIAGNOSIS — R3914 Feeling of incomplete bladder emptying: Secondary | ICD-10-CM | POA: Diagnosis not present

## 2020-01-31 DIAGNOSIS — R3914 Feeling of incomplete bladder emptying: Secondary | ICD-10-CM | POA: Diagnosis not present

## 2020-02-27 DIAGNOSIS — R3914 Feeling of incomplete bladder emptying: Secondary | ICD-10-CM | POA: Diagnosis not present

## 2020-03-28 DIAGNOSIS — R3914 Feeling of incomplete bladder emptying: Secondary | ICD-10-CM | POA: Diagnosis not present

## 2020-04-19 DIAGNOSIS — R110 Nausea: Secondary | ICD-10-CM | POA: Diagnosis not present

## 2020-04-19 DIAGNOSIS — E782 Mixed hyperlipidemia: Secondary | ICD-10-CM | POA: Diagnosis not present

## 2020-04-19 DIAGNOSIS — I10 Essential (primary) hypertension: Secondary | ICD-10-CM | POA: Diagnosis not present

## 2020-04-19 DIAGNOSIS — G4700 Insomnia, unspecified: Secondary | ICD-10-CM | POA: Diagnosis not present

## 2020-04-19 DIAGNOSIS — R413 Other amnesia: Secondary | ICD-10-CM | POA: Diagnosis not present

## 2020-04-19 DIAGNOSIS — F411 Generalized anxiety disorder: Secondary | ICD-10-CM | POA: Diagnosis not present

## 2020-04-19 DIAGNOSIS — R002 Palpitations: Secondary | ICD-10-CM | POA: Diagnosis not present

## 2020-04-23 DIAGNOSIS — R3914 Feeling of incomplete bladder emptying: Secondary | ICD-10-CM | POA: Diagnosis not present

## 2020-05-21 DIAGNOSIS — R3914 Feeling of incomplete bladder emptying: Secondary | ICD-10-CM | POA: Diagnosis not present

## 2020-05-28 ENCOUNTER — Encounter: Payer: Self-pay | Admitting: Gastroenterology

## 2020-06-18 DIAGNOSIS — R3914 Feeling of incomplete bladder emptying: Secondary | ICD-10-CM | POA: Diagnosis not present

## 2020-07-23 DIAGNOSIS — R3914 Feeling of incomplete bladder emptying: Secondary | ICD-10-CM | POA: Diagnosis not present

## 2020-08-21 DIAGNOSIS — R3914 Feeling of incomplete bladder emptying: Secondary | ICD-10-CM | POA: Diagnosis not present

## 2020-08-30 DIAGNOSIS — Z906 Acquired absence of other parts of urinary tract: Secondary | ICD-10-CM | POA: Diagnosis not present

## 2020-08-30 DIAGNOSIS — R399 Unspecified symptoms and signs involving the genitourinary system: Secondary | ICD-10-CM | POA: Diagnosis not present

## 2020-08-30 DIAGNOSIS — N281 Cyst of kidney, acquired: Secondary | ICD-10-CM | POA: Diagnosis not present

## 2020-10-02 DIAGNOSIS — R3914 Feeling of incomplete bladder emptying: Secondary | ICD-10-CM | POA: Diagnosis not present

## 2020-10-26 DIAGNOSIS — F411 Generalized anxiety disorder: Secondary | ICD-10-CM | POA: Diagnosis not present

## 2020-10-26 DIAGNOSIS — I10 Essential (primary) hypertension: Secondary | ICD-10-CM | POA: Diagnosis not present

## 2020-10-26 DIAGNOSIS — E782 Mixed hyperlipidemia: Secondary | ICD-10-CM | POA: Diagnosis not present

## 2020-10-26 DIAGNOSIS — Z23 Encounter for immunization: Secondary | ICD-10-CM | POA: Diagnosis not present

## 2020-10-26 DIAGNOSIS — R413 Other amnesia: Secondary | ICD-10-CM | POA: Diagnosis not present

## 2020-10-26 DIAGNOSIS — R002 Palpitations: Secondary | ICD-10-CM | POA: Diagnosis not present

## 2020-10-29 DIAGNOSIS — N3010 Interstitial cystitis (chronic) without hematuria: Secondary | ICD-10-CM | POA: Diagnosis not present

## 2020-10-29 DIAGNOSIS — Z906 Acquired absence of other parts of urinary tract: Secondary | ICD-10-CM | POA: Diagnosis not present

## 2020-10-29 DIAGNOSIS — N281 Cyst of kidney, acquired: Secondary | ICD-10-CM | POA: Diagnosis not present

## 2020-10-29 DIAGNOSIS — Z9889 Other specified postprocedural states: Secondary | ICD-10-CM | POA: Diagnosis not present

## 2020-10-30 ENCOUNTER — Encounter: Payer: Self-pay | Admitting: Physician Assistant

## 2020-10-31 DIAGNOSIS — Z1231 Encounter for screening mammogram for malignant neoplasm of breast: Secondary | ICD-10-CM | POA: Diagnosis not present

## 2020-10-31 DIAGNOSIS — R3914 Feeling of incomplete bladder emptying: Secondary | ICD-10-CM | POA: Diagnosis not present

## 2020-11-08 ENCOUNTER — Other Ambulatory Visit: Payer: Self-pay

## 2020-11-08 ENCOUNTER — Other Ambulatory Visit (INDEPENDENT_AMBULATORY_CARE_PROVIDER_SITE_OTHER): Payer: PPO

## 2020-11-08 ENCOUNTER — Ambulatory Visit: Payer: PPO | Admitting: Physician Assistant

## 2020-11-08 ENCOUNTER — Encounter: Payer: Self-pay | Admitting: Physician Assistant

## 2020-11-08 VITALS — BP 136/71 | HR 72 | Resp 18 | Ht 65.0 in | Wt 148.0 lb

## 2020-11-08 DIAGNOSIS — F0280 Dementia in other diseases classified elsewhere without behavioral disturbance: Secondary | ICD-10-CM

## 2020-11-08 DIAGNOSIS — G309 Alzheimer's disease, unspecified: Secondary | ICD-10-CM | POA: Diagnosis not present

## 2020-11-08 DIAGNOSIS — R413 Other amnesia: Secondary | ICD-10-CM

## 2020-11-08 HISTORY — DX: Dementia in other diseases classified elsewhere, unspecified severity, without behavioral disturbance, psychotic disturbance, mood disturbance, and anxiety: F02.80

## 2020-11-08 LAB — VITAMIN B12: Vitamin B-12: 301 pg/mL (ref 211–911)

## 2020-11-08 LAB — TSH: TSH: 2.81 u[IU]/mL (ref 0.35–5.50)

## 2020-11-08 MED ORDER — DONEPEZIL HCL 10 MG PO TABS: ORAL_TABLET | ORAL | 11 refills | Status: DC

## 2020-11-08 NOTE — Progress Notes (Signed)
Assessment/Plan:   [name] is a 80 y.o. year old female with risk factors including irritable bowel syndrome, fibromyalgia, status postcholecystectomy and neobladder creation, followed by urology, with recurrent UTIs, anxiety, depression and  seen today for evaluation of memory loss. MoCA today is 12/30, with deficiencies in visuospatial executive, calculation, language, and delayed recall 0/6.  Orientation 6/6.History is not very clear, as we are unable to corroborate with any other family members.  Other concerns are medication adherence, and driving.  It is also unclear the role of depression and anxiety in this patient.   Recommendations:   Moderate dementia likely due to AD   MRI brain with/without contrast to assess for underlying structural abnormality and assess vascular load  Neurocognitive testing to further evaluate cognitive concerns and determine underlying cause of memory changes, including potential contribution from sleep, anxiety, or depression  Check B12, TSH Recommend close follow-up with urology, as she is at risk for UTIs which could worsen her memory issues Discussed the importance of regular daily schedule with inclusion of crossword puzzles to maintain brain function.  Continue to monitor mood with PCP.  May need psychiatric follow-up if not already under the care of a psychiatrist. Monitor driving closely Recommend 24/7 care for safety. Start Donepezil 10 MG, Take half tablet (5 mg) daily for 2 weeks, then increase to the full tablet at 10 mg daily.  Side effects discussed Stay active at least 30 minutes at least 3 times a week.  Naps should be scheduled and should be no longer than 60 minutes and should not occur after 2 PM.  Mediterranean diet is recommended  Folllow up once results above are available   Subjective:    The patient is seen in neurologic consultation at the request of Alroy Dust, L.Marlou Sa, MD for the evaluation of memory.  The patient is here  alone, and when asked if there is anyone that we could talk to on the phone, she said "no".  History is very limited.  This is a 80 year old woman, who denies having any memory issues.  Apparently, she was seen at the office of her primary care physician 1 year ago,at which time she had an MMSE, as her son was reporting that the patient was having difficulties recalling.  This yielded a score of 28/30.  During the last visit to her PCP, her son continued to report worsening of her memory, which she denied.  This scored 24/30, equals an MoCA of 18.  She states that her memory is "very good ". Her son states that she had difficulty remembering even "major life events that she should remember ".  In the past, she was taking Prevagen, without any improvement.   She denies repeating the same stories or asking the same questions.  When asked about her mood she reports "fine".  Per chart report, her son states that her husband died about 3 years ago, with associated depression, she is on medications for that.   Of note, the patient does not elaborate, only answers to questions yes or no.  She likes crossword puzzles, and word fining.  She watches TV often.  When asked if she sleeps well, she agrees.  She denies any vivid dreams or sleepwalking, hallucinations or paranoia.  "Sometimes ", she leaves objects but not in unusual places.  She is independent of bathing and dressing.  When I ask about missing medications, she denies it, although she states that takes it "every other day ".-Some of the  medications are prescribed daily, and some of them every 8 hours as needed. It is unclear who is in charge of the finances, when asked that question, the patient reports "not support from what ever ".  Appetite is good, denies trouble swallowing.  She cooks "sometimes ".  Denies leaving the stove or the faucet on.  She ambulates daily without difficulty, without the use of a walker or a cane.  She denies any falls or head injuries.   She reports that she drives, and denies getting lost.  Denies headaches, double vision, dizziness, focal numbness or tingling, unilateral weakness or tremors.  She has a history of urinary issues in view of neobladder, followed by urology, with frequent UTIs, last in August of this year.  Denies constipation or diarrhea, or anosmia.  Denies a history of sleep apnea, alcohol or tobacco.  No family history of dementia.   Labs 10/29/2020 Total cholesterol 239, LDL 152, non-HDL 179, ALT elevated.  Triglycerides and HDL be normal.    No Known Allergies  Current Outpatient Medications  Medication Instructions   calcium carbonate (OSCAL) 1500 (600 Ca) MG TABS tablet 2 tablets, Oral, 2 times daily with meals   citalopram (CELEXA) 10 mg, Oral, Daily   clonazePAM (KLONOPIN) 0.5 mg, Daily PRN   dicyclomine (BENTYL) 10 mg, Oral, 2 times daily   donepezil (ARICEPT) 10 MG tablet Take half tablet (5 mg) daily for 2 weeks, then increase to the full tablet at 10 mg daily   metoprolol succinate (TOPROL-XL) 50 mg, Oral, Daily   nitrofurantoin (MACRODANTIN) 50 mg, See admin instructions   nitrofurantoin, macrocrystal-monohydrate, (MACROBID) 100 MG capsule Take twice daily for 10 days then take one nightly thereafter until completed.   Omega-3 Fatty Acids (FISH OIL PO) 1 tablet, Oral, As needed, Occasionally - when she remembers   ondansetron (ZOFRAN) 8 mg, Oral, Every 8 hours PRN   pantoprazole (PROTONIX) 40 mg, Oral, Daily     VITALS:   Vitals:   11/08/20 0948  BP: 136/71  Pulse: 72  Resp: 18  SpO2: 97%  Weight: 148 lb (67.1 kg)  Height: 5\' 5"  (1.651 m)   Depression screen [facility] 2/9 12/22/2015  Decreased Interest 0  Down, Depressed, Hopeless 0  PHQ - 2 Score 0    PHYSICAL EXAM   HEENT:  Normocephalic, atraumatic. The mucous membranes are moist. The superficial temporal arteries are without ropiness or tenderness. Cardiovascular: Regular rate and rhythm. Lungs: Clear to auscultation  bilaterally. Neck: There are no carotid bruits noted bilaterally.  NEUROLOGICAL: Montreal Cognitive Assessment  11/08/2020  Visuospatial/ Executive (0/5) 0  Naming (0/3) 3  Attention: Read list of digits (0/2) 2  Attention: Read list of letters (0/1) 1  Attention: Serial 7 subtraction starting at 100 (0/3) 0  Language: Repeat phrase (0/2) 0  Language : Fluency (0/1) 0  Abstraction (0/2) 1  Delayed Recall (0/5) 0  Orientation (0/6) 5  Total 12  Adjusted Score (based on education) 12   No flowsheet data found.  No flowsheet data found.   Orientation:  Alert and oriented to person, place and time. No aphasia or dysarthria. Fund of knowledge is appropriate. Recent and remote memory impaired.  Attention and concentration are normal.  Able to name objects and repeat phrases. Delayed recall 0/5.  Unable to draw, fluency 0, and 1/2 obstruction Cranial nerves: There is good facial symmetry. Extraocular muscles are intact and visual fields are full to confrontational testing. Speech is fluent and clear. Soft palate rises  symmetrically and there is no tongue deviation. Hearing is intact to conversational tone. Tone: Tone is good throughout. Sensation: Sensation is intact to light touch and pinprick throughout. Vibration is intact at the bilateral big toe.There is no extinction with double simultaneous stimulation. There is no sensory dermatomal level identified. Coordination: The patient has no difficulty with RAM's or FNF bilaterally. Normal finger to nose  Motor: Strength is 5/5 in the bilateral upper and lower extremities. There is no pronator drift. There are no fasciculations noted. DTR's: Deep tendon reflexes are 2/4 at the bilateral biceps, triceps, brachioradialis, patella and achilles.  Plantar responses are downgoing bilaterally. Gait and Station: The patient is able to ambulate without difficulty.The patient is able to heel toe walk without any difficulty.The patient is able to ambulate  in a tandem fashion. The patient is able to stand in the Romberg position.     Thank you for allowing [place] the opportunity to participate in the care of this nice patient. Please do not hesitate to contact us for any questions or concerns.   Total time spent on today's visit was 40 minutes, including both face-to-face time and nonface-to-face time.  Time included that spent on review of records (prior notes available to me/labs/imaging if pertinent), discussing treatment and goals, answering patient's questions and coordinating care.  Cc:  Alroy Dust, L.Marlou Sa, MD  Sharene Butters 11/08/2020 12:42 PM

## 2020-11-08 NOTE — Patient Instructions (Addendum)
It was a pleasure to see you today at our office.   Recommendations:  Neurocognitive evaluation at our office MRI of the brain, the radiology office will call you to arrange you appointment Check labs today We will start donepezil half tablet (5mg ) daily for 2  weeks.  If you are tolerating the medication, then after 2 weeks, we will increase the dose to a full tablet of 10 mg daily.  Side effects include nausea, vomiting, diarrhea, vivid dreams, and muscle cramps.  Please call the clinic if you experience any of these symptoms.  Follow up after neurocognitive testing  RECOMMENDATIONS FOR ALL PATIENTS WITH MEMORY PROBLEMS: 1. Continue to exercise (Recommend 30 minutes of walking everyday, or 3 hours every week) 2. Increase social interactions - continue going to [place] and enjoy social gatherings with friends and family 3. Eat healthy, avoid fried foods and eat more fruits and vegetables 4. Maintain adequate blood pressure, blood sugar, and blood cholesterol level. Reducing the risk of stroke and cardiovascular disease also helps promoting better memory. 5. Avoid stressful situations. Live a simple life and avoid aggravations. Organize your time and prepare for the next day in anticipation. 6. Sleep well, avoid any interruptions of sleep and avoid any distractions in the bedroom that may interfere with adequate sleep quality 7. Avoid sugar, avoid sweets as there is a strong link between excessive sugar intake, diabetes, and cognitive impairment We discussed the Mediterranean diet, which has been shown to help patients reduce the risk of progressive memory disorders and reduces cardiovascular risk. This includes eating fish, eat fruits and green leafy vegetables, nuts like almonds and hazelnuts, walnuts, and also use olive oil. Avoid fast foods and fried foods as much as possible. Avoid sweets and sugar as sugar use has been linked to worsening of memory function.  There is always a concern of  gradual progression of memory problems. If this is the case, then we may need to adjust level of care according to patient needs. Support, both to the patient and caregiver, should then be put into place.      You have been referred for a neuropsychological evaluation (i.e., evaluation of memory and thinking abilities). Please bring someone with you to this appointment if possible, as it is helpful for the doctor to hear from both you and another adult who knows you well. Please bring eyeglasses and hearing aids if you wear them.    The evaluation will take approximately 3 hours and has two parts:   The first part is a clinical interview with the neuropsychologist (Dr. Melvyn Novas or Dr. Nicole Kindred). During the interview, the neuropsychologist will speak with you and the individual you brought to the appointment.    The second part of the evaluation is testing with the doctor's technician Hinton Dyer or Maudie Mercury). During the testing, the technician will ask you to remember different types of material, solve problems, and answer some questionnaires. Your family member will not be present for this portion of the evaluation.   Please note: We must reserve several hours of the neuropsychologist's time and the psychometrician's time for your evaluation appointment. As such, there is a No-Show fee of $100. If you are unable to attend any of your appointments, please contact our office as soon as possible to reschedule.    FALL PRECAUTIONS: Be cautious when walking. Scan the area for obstacles that may increase the risk of trips and falls. When getting up in the mornings, sit up at the edge of the bed  for a few minutes before getting out of bed. Consider elevating the bed at the head end to avoid drop of blood pressure when getting up. Walk always in a well-lit room (use night lights in the walls). Avoid area rugs or power cords from appliances in the middle of the walkways. Use a walker or a cane if necessary and consider  physical therapy for balance exercise. Get your eyesight checked regularly.  FINANCIAL OVERSIGHT: Supervision, especially oversight when making financial decisions or transactions is also recommended.  HOME SAFETY: Consider the safety of the kitchen when operating appliances like stoves, microwave oven, and blender. Consider having supervision and share cooking responsibilities until no longer able to participate in those. Accidents with firearms and other hazards in the house should be identified and addressed as well.   ABILITY TO BE LEFT ALONE: If patient is unable to contact 911 operator, consider using LifeLine, or when the need is there, arrange for someone to stay with patients. Smoking is a fire hazard, consider supervision or cessation. Risk of wandering should be assessed by caregiver and if detected at any point, supervision and safe proof recommendations should be instituted.  MEDICATION SUPERVISION: Inability to self-administer medication needs to be constantly addressed. Implement a mechanism to ensure safe administration of the medications.   DRIVING: Regarding driving, in patients with progressive memory problems, driving will be impaired. We advise to have someone else do the driving if trouble finding directions or if minor accidents are reported. Independent driving assessment is available to determine safety of driving.   If you are interested in the driving assessment, you can contact the following:  [employer] in [place]  [place] [phone] or [phone]    [place] refers to food and lifestyle choices that are based on the traditions of countries located on the [employer]. This way of eating has been shown to help prevent certain conditions and improve outcomes for people who have chronic diseases, like  kidney disease and heart disease. What are tips for following this plan? Lifestyle  Cook and eat meals together with your family, when possible. Drink enough fluid to keep your urine clear or pale yellow. Be physically active every day. This includes: Aerobic exercise like running or swimming. Leisure activities like gardening, walking, or housework. Get 7-8 hours of sleep each night. If recommended by your health care provider, drink red wine in moderation. This means 1 glass a day for nonpregnant women and 2 glasses a day for men. A glass of wine equals 5 oz (150 mL). Reading food labels  Check the serving size of packaged foods. For foods such as rice and pasta, the serving size refers to the amount of cooked product, not dry. Check the total fat in packaged foods. Avoid foods that have saturated fat or trans fats. Check the ingredients list for added sugars, such as corn syrup. Shopping  At the grocery store, buy most of your food from the areas near the walls of the store. This includes: Fresh fruits and vegetables (produce). Grains, beans, nuts, and seeds. Some of these may be available in unpackaged forms or large amounts (in bulk). Fresh seafood. Poultry and eggs. Low-fat dairy products. Buy whole ingredients instead of prepackaged foods. Buy fresh fruits and vegetables in-season from local farmers markets. Buy frozen fruits and vegetables in resealable bags. If you do not have access to quality fresh seafood, buy  precooked frozen shrimp or canned fish, such as tuna, salmon, or sardines. Buy small amounts of raw or cooked vegetables, salads, or olives from the deli or salad bar at your store. Stock your pantry so you always have certain foods on hand, such as olive oil, canned tuna, canned tomatoes, rice, pasta, and beans. Cooking  Cook foods with extra-virgin olive oil instead of using butter or other vegetable oils. Have meat as a side dish, and have vegetables or grains as  your main dish. This means having meat in small portions or adding small amounts of meat to foods like pasta or stew. Use beans or vegetables instead of meat in common dishes like chili or lasagna. Experiment with different cooking methods. Try roasting or broiling vegetables instead of steaming or sauteing them. Add frozen vegetables to soups, stews, pasta, or rice. Add nuts or seeds for added healthy fat at each meal. You can add these to yogurt, salads, or vegetable dishes. Marinate fish or vegetables using olive oil, lemon juice, garlic, and fresh herbs. Meal planning  Plan to eat 1 vegetarian meal one day each week. Try to work up to 2 vegetarian meals, if possible. Eat seafood 2 or more times a week. Have healthy snacks readily available, such as: Vegetable sticks with hummus. Greek yogurt. Fruit and nut trail mix. Eat balanced meals throughout the week. This includes: Fruit: 2-3 servings a day Vegetables: 4-5 servings a day Low-fat dairy: 2 servings a day Fish, poultry, or lean meat: 1 serving a day Beans and legumes: 2 or more servings a week Nuts and seeds: 1-2 servings a day Whole grains: 6-8 servings a day Extra-virgin olive oil: 3-4 servings a day Limit red meat and sweets to only a few servings a month What are my food choices? Mediterranean diet Recommended Grains: Whole-grain pasta. Brown rice. Bulgar wheat. Polenta. Couscous. Whole-wheat bread. [place]. Vegetables: Artichokes. Beets. Broccoli. Cabbage. Carrots. Eggplant. Green beans. Chard. Kale. Spinach. Onions. Leeks. Peas. Squash. Tomatoes. Peppers. Radishes. Fruits: Apples. Apricots. Avocado. Berries. Bananas. Cherries. Dates. Figs. Grapes. Lemons. Melon. Oranges. Peaches. Plums. Pomegranate. Meats and other protein foods: Beans. Almonds. Sunflower seeds. Pine nuts. Peanuts. [place]. Salmon. Scallops. Shrimp. [place]. Tilapia. Clams. Oysters. Eggs. Dairy: Low-fat milk. Cheese. Greek yogurt. Beverages: Water. Red  wine. Herbal tea. Fats and oils: Extra virgin olive oil. Avocado oil. Grape seed oil. Sweets and desserts: [place] yogurt with honey. Baked apples. Poached pears. Trail mix. Seasoning and other foods: Basil. Cilantro. Coriander. Cumin. Mint. Parsley. Sage. Rosemary. Tarragon. Garlic. Oregano. Thyme. Pepper. Balsalmic vinegar. Tahini. Hummus. Tomato sauce. Olives. Mushrooms. Limit these Grains: Prepackaged pasta or rice dishes. Prepackaged cereal with added sugar. Vegetables: Deep fried potatoes (french fries). Fruits: Fruit canned in syrup. Meats and other protein foods: Beef. Pork. Lamb. Poultry with skin. Hot dogs. Berniece Salines. Dairy: Ice cream. Sour cream. Whole milk. Beverages: Juice. Sugar-sweetened soft drinks. Beer. Liquor and spirits. Fats and oils: Butter. Canola oil. Vegetable oil. Beef fat (tallow). Lard. Sweets and desserts: Cookies. Cakes. Pies. Candy. Seasoning and other foods: Mayonnaise. Premade sauces and marinades. The items listed may not be a complete list. Talk with your dietitian about what dietary choices are right for you. Summary The Mediterranean diet includes both food and lifestyle choices. Eat a variety of fresh fruits and vegetables, beans, nuts, seeds, and whole grains. Limit the amount of red meat and sweets that you eat. Talk with your health care provider about whether it is safe for you to drink red wine in moderation. This  means 1 glass a day for nonpregnant women and 2 glasses a day for men. A glass of wine equals 5 oz (150 mL). This information is not intended to replace advice given to you by your health care provider. Make sure you discuss any questions you have with your health care provider. Document Released: 09/06/2015 Document Revised: 10/09/2015 Document Reviewed: 09/06/2015 Elsevier Interactive Patient Education  2017 [employer].     We have sent a referral to [place] for your MRI and they will call you directly to schedule your  appointment. They are located at [place]. If you need to contact them directly please call [phone].   Your provider has requested that you have labwork completed today. Please go to [facility] ([id number]) on the second floor of this building before leaving the office today. You do not need to check in. If you are not called within 15 minutes please check with the front desk.

## 2020-11-14 DIAGNOSIS — R922 Inconclusive mammogram: Secondary | ICD-10-CM | POA: Diagnosis not present

## 2020-11-14 DIAGNOSIS — R928 Other abnormal and inconclusive findings on diagnostic imaging of breast: Secondary | ICD-10-CM | POA: Diagnosis not present

## 2020-12-13 ENCOUNTER — Other Ambulatory Visit: Payer: Self-pay

## 2020-12-13 ENCOUNTER — Telehealth: Payer: Self-pay | Admitting: Physician Assistant

## 2020-12-13 ENCOUNTER — Ambulatory Visit
Admission: RE | Admit: 2020-12-13 | Discharge: 2020-12-13 | Disposition: A | Discharge status: Home / Self Care | Payer: PPO | Source: Ambulatory Visit | Attending: Physician Assistant | Admitting: Physician Assistant

## 2020-12-13 DIAGNOSIS — I6782 Cerebral ischemia: Secondary | ICD-10-CM | POA: Diagnosis not present

## 2020-12-13 DIAGNOSIS — R3914 Feeling of incomplete bladder emptying: Secondary | ICD-10-CM | POA: Diagnosis not present

## 2020-12-13 DIAGNOSIS — R413 Other amnesia: Secondary | ICD-10-CM | POA: Diagnosis not present

## 2020-12-13 DIAGNOSIS — G319 Degenerative disease of nervous system, unspecified: Secondary | ICD-10-CM | POA: Diagnosis not present

## 2020-12-13 NOTE — Telephone Encounter (Signed)
Pt son called, he would like a call to see what exactly is going on with his mother. He is not sure she is taking her donepezil, if she is, he does not think its helping. He would like to know the results of her MRI. He would like to know what sara thought with their first visit. He is setting up her my chart so he can be more involved

## 2020-12-14 NOTE — Telephone Encounter (Signed)
Voiced results to [facility] her son who is listed on her DPR.

## 2020-12-14 NOTE — Progress Notes (Signed)
Advised to her son of results, who is on the DPR, voiced understanding

## 2020-12-14 NOTE — Telephone Encounter (Signed)
Patient's son Catalina [place] returned call.

## 2020-12-14 NOTE — Telephone Encounter (Signed)
Can you call Matt and schedule. In 2-3 months.

## 2020-12-14 NOTE — Telephone Encounter (Signed)
No answer on Matt's cell number, son he is on the [facility]. Will call again in a bit. 9:03am

## 2020-12-14 NOTE — Telephone Encounter (Signed)
Son is on the [place] and he is POA. Please call matt when you can.

## 2020-12-14 NOTE — Telephone Encounter (Signed)
I do not see the son listed on the DPR.

## 2021-01-10 DIAGNOSIS — R3914 Feeling of incomplete bladder emptying: Secondary | ICD-10-CM | POA: Diagnosis not present

## 2021-01-11 DIAGNOSIS — R399 Unspecified symptoms and signs involving the genitourinary system: Secondary | ICD-10-CM | POA: Diagnosis not present

## 2021-01-11 DIAGNOSIS — N3090 Cystitis, unspecified without hematuria: Secondary | ICD-10-CM | POA: Diagnosis not present

## 2021-01-29 ENCOUNTER — Ambulatory Visit: Payer: PPO | Admitting: Physician Assistant

## 2021-01-29 ENCOUNTER — Other Ambulatory Visit: Payer: Self-pay

## 2021-01-29 ENCOUNTER — Encounter: Payer: Self-pay | Admitting: Physician Assistant

## 2021-01-29 VITALS — BP 130/63 | HR 106 | Resp 20 | Wt 146.0 lb

## 2021-01-29 DIAGNOSIS — F0390 Unspecified dementia without behavioral disturbance: Secondary | ICD-10-CM

## 2021-01-29 MED ORDER — DONEPEZIL HCL 10 MG PO TABS: ORAL_TABLET | ORAL | 11 refills | Status: DC

## 2021-01-29 NOTE — Patient Instructions (Signed)
It was a pleasure to see you today at our office.   Recommendations:  Meds: Follow up in 6  months Continue donepezil 10 mg daily.     RECOMMENDATIONS FOR ALL PATIENTS WITH MEMORY PROBLEMS: 1. Continue to exercise (Recommend 30 minutes of walking everyday, or 3 hours every week) 2. Increase social interactions - continue going to [place] and enjoy social gatherings with friends and family 3. Eat healthy, avoid fried foods and eat more fruits and vegetables 4. Maintain adequate blood pressure, blood sugar, and blood cholesterol level. Reducing the risk of stroke and cardiovascular disease also helps promoting better memory. 5. Avoid stressful situations. Live a simple life and avoid aggravations. Organize your time and prepare for the next day in anticipation. 6. Sleep well, avoid any interruptions of sleep and avoid any distractions in the bedroom that may interfere with adequate sleep quality 7. Avoid sugar, avoid sweets as there is a strong link between excessive sugar intake, diabetes, and cognitive impairment We discussed the Mediterranean diet, which has been shown to help patients reduce the risk of progressive memory disorders and reduces cardiovascular risk. This includes eating fish, eat fruits and green leafy vegetables, nuts like almonds and hazelnuts, walnuts, and also use olive oil. Avoid fast foods and fried foods as much as possible. Avoid sweets and sugar as sugar use has been linked to worsening of memory function.  There is always a concern of gradual progression of memory problems. If this is the case, then we may need to adjust level of care according to patient needs. Support, both to the patient and caregiver, should then be put into place.    The [employer] is here all day, every day for people facing Alzheimers disease through our free 24/7 Helpline: [phone]. The Helpline provides reliable information and support to all those who need assistance, such  as individuals living with memory loss, Alzheimer's or other dementia, caregivers, health care professionals and the public.  Our highly trained and knowledgeable staff can help you with: Understanding memory loss, dementia and Alzheimer's  Medications and other treatment options  General information about aging and brain health  Skills to provide quality care and to find the best care from professionals  Legal, financial and living-arrangement decisions Our Helpline also features: Confidential care consultation provided by master's level clinicians who can help with decision-making support, crisis assistance and education on issues families face every day  Help in a caller's preferred language using our translation service that features more than 200 languages and dialects  Referrals to local community programs, services and ongoing support     FALL PRECAUTIONS: Be cautious when walking. Scan the area for obstacles that may increase the risk of trips and falls. When getting up in the mornings, sit up at the edge of the bed for a few minutes before getting out of bed. Consider elevating the bed at the head end to avoid drop of blood pressure when getting up. Walk always in a well-lit room (use night lights in the walls). Avoid area rugs or power cords from appliances in the middle of the walkways. Use a walker or a cane if necessary and consider physical therapy for balance exercise. Get your eyesight checked regularly.  FINANCIAL OVERSIGHT: Supervision, especially oversight when making financial decisions or transactions is also recommended.  HOME SAFETY: Consider the safety of the kitchen when operating appliances like stoves, microwave oven, and blender. Consider having supervision and share cooking responsibilities until no longer able to participate  in those. Accidents with firearms and other hazards in the house should be identified and addressed as well.   ABILITY TO BE LEFT ALONE: If  patient is unable to contact 911 operator, consider using LifeLine, or when the need is there, arrange for someone to stay with patients. Smoking is a fire hazard, consider supervision or cessation. Risk of wandering should be assessed by caregiver and if detected at any point, supervision and safe proof recommendations should be instituted.  MEDICATION SUPERVISION: Inability to self-administer medication needs to be constantly addressed. Implement a mechanism to ensure safe administration of the medications.   DRIVING: Regarding driving, in patients with progressive memory problems, driving will be impaired. We advise to have someone else do the driving if trouble finding directions or if minor accidents are reported. Independent driving assessment is available to determine safety of driving.   If you are interested in the driving assessment, you can contact the following:  [employer] in [place]  [place] [phone] or [phone]      [place] refers to food and lifestyle choices that are based on the traditions of countries located on the [employer]. This way of eating has been shown to help prevent certain conditions and improve outcomes for people who have chronic diseases, like kidney disease and heart disease. What are tips for following this plan? Lifestyle  Cook and eat meals together with your family, when possible. Drink enough fluid to keep your urine clear or pale yellow. Be physically active every day. This includes: Aerobic exercise like running or swimming. Leisure activities like gardening, walking, or housework. Get 7-8 hours of sleep each night. If recommended by your health care provider, drink red wine in moderation. This means 1 glass a day for nonpregnant women and 2 glasses a day for men. A glass of wine  equals 5 oz (150 mL). Reading food labels  Check the serving size of packaged foods. For foods such as rice and pasta, the serving size refers to the amount of cooked product, not dry. Check the total fat in packaged foods. Avoid foods that have saturated fat or trans fats. Check the ingredients list for added sugars, such as corn syrup. Shopping  At the grocery store, buy most of your food from the areas near the walls of the store. This includes: Fresh fruits and vegetables (produce). Grains, beans, nuts, and seeds. Some of these may be available in unpackaged forms or large amounts (in bulk). Fresh seafood. Poultry and eggs. Low-fat dairy products. Buy whole ingredients instead of prepackaged foods. Buy fresh fruits and vegetables in-season from local farmers markets. Buy frozen fruits and vegetables in resealable bags. If you do not have access to quality fresh seafood, buy precooked frozen shrimp or canned fish, such as tuna, salmon, or sardines. Buy small amounts of raw or cooked vegetables, salads, or olives from the deli or salad bar at your store. Stock your pantry so you always have certain foods on hand, such as olive oil, canned tuna, canned tomatoes, rice, pasta, and beans. Cooking  Cook foods with extra-virgin olive oil instead of using butter or other vegetable oils. Have meat as a side dish, and have vegetables or grains as your main dish. This means having meat in small portions or adding small amounts of meat to foods like pasta or stew. Use beans or vegetables instead of meat in common dishes like  chili or lasagna. Experiment with different cooking methods. Try roasting or broiling vegetables instead of steaming or sauteing them. Add frozen vegetables to soups, stews, pasta, or rice. Add nuts or seeds for added healthy fat at each meal. You can add these to yogurt, salads, or vegetable dishes. Marinate fish or vegetables using olive oil, lemon juice, garlic, and fresh  herbs. Meal planning  Plan to eat 1 vegetarian meal one day each week. Try to work up to 2 vegetarian meals, if possible. Eat seafood 2 or more times a week. Have healthy snacks readily available, such as: Vegetable sticks with hummus. Greek yogurt. Fruit and nut trail mix. Eat balanced meals throughout the week. This includes: Fruit: 2-3 servings a day Vegetables: 4-5 servings a day Low-fat dairy: 2 servings a day Fish, poultry, or lean meat: 1 serving a day Beans and legumes: 2 or more servings a week Nuts and seeds: 1-2 servings a day Whole grains: 6-8 servings a day Extra-virgin olive oil: 3-4 servings a day Limit red meat and sweets to only a few servings a month What are my food choices? Mediterranean diet Recommended Grains: Whole-grain pasta. Brown rice. Bulgar wheat. Polenta. Couscous. Whole-wheat bread. [place]. Vegetables: Artichokes. Beets. Broccoli. Cabbage. Carrots. Eggplant. Green beans. Chard. Kale. Spinach. Onions. Leeks. Peas. Squash. Tomatoes. Peppers. Radishes. Fruits: Apples. Apricots. Avocado. Berries. Bananas. Cherries. Dates. Figs. Grapes. Lemons. Melon. Oranges. Peaches. Plums. Pomegranate. Meats and other protein foods: Beans. Almonds. Sunflower seeds. Pine nuts. Peanuts. [place]. Salmon. Scallops. Shrimp. [place]. Tilapia. Clams. Oysters. Eggs. Dairy: Low-fat milk. Cheese. Greek yogurt. Beverages: Water. Red wine. Herbal tea. Fats and oils: Extra virgin olive oil. Avocado oil. Grape seed oil. Sweets and desserts: [place] yogurt with honey. Baked apples. Poached pears. Trail mix. Seasoning and other foods: Basil. Cilantro. Coriander. Cumin. Mint. Parsley. Sage. Rosemary. Tarragon. Garlic. Oregano. Thyme. Pepper. Balsalmic vinegar. Tahini. Hummus. Tomato sauce. Olives. Mushrooms. Limit these Grains: Prepackaged pasta or rice dishes. Prepackaged cereal with added sugar. Vegetables: Deep fried potatoes (french fries). Fruits: Fruit canned in syrup. Meats and  other protein foods: Beef. Pork. Lamb. Poultry with skin. Hot dogs. [place] Salines. Dairy: Ice cream. Sour cream. Whole milk. Beverages: Juice. Sugar-sweetened soft drinks. Beer. Liquor and spirits. Fats and oils: Butter. Canola oil. Vegetable oil. Beef fat (tallow). Lard. Sweets and desserts: Cookies. Cakes. Pies. Candy. Seasoning and other foods: Mayonnaise. Premade sauces and marinades. The items listed may not be a complete list. Talk with your dietitian about what dietary choices are right for you. Summary The Mediterranean diet includes both food and lifestyle choices. Eat a variety of fresh fruits and vegetables, beans, nuts, seeds, and whole grains. Limit the amount of red meat and sweets that you eat. Talk with your health care provider about whether it is safe for you to drink red wine in moderation. This means 1 glass a day for nonpregnant women and 2 glasses a day for men. A glass of wine equals 5 oz (150 mL). This information is not intended to replace advice given to you by your health care provider. Make sure you discuss any questions you have with your health care provider. Document Released: 09/06/2015 Document Revised: 10/09/2015 Document Reviewed: 09/06/2015 Elsevier Interactive Patient Education  2017 [employer].

## 2021-01-29 NOTE — Progress Notes (Signed)
Assessment/Plan:    Late onset dementia without behavioral disturbance, likely due to Alzheimer's disease   Recommendations:   Discussed in detail to the patient and her son the diagnosis of dementia, pertinent studies, and therapies as well as the importance of taking donepezil regularly. Discussed safety both in and out of the home.  Discussed the importance of regular daily schedule with inclusion of crossword puzzles to maintain brain function.  Continue to monitor mood by PCP Stay active at least 30 minutes at least 3 times a week.  Naps should be scheduled and should be no longer than 60 minutes and should not occur after 2 PM.  Mediterranean diet is recommended  Control cardiovascular risk factors  Continue donepezil 10 mg daily Side effects were discussed Neurocognitive exam has been scheduled for April 2022 for clarity of diagnosis Follow up in 6 months.   Case discussed with Dr. Delice Lesch who agrees with the plan       Subjective:    [name] is a very pleasant 81 y.o. RH female with a history of  irritable bowel syndrome, fibromyalgia, status postcholecystectomy and neobladder creation, followed by urology, with recurrent UTIs, anxiety, depression and  seen today for evaluation of memory loss. MoCA in 10/2020 during her first visit, was 12/30. She was placed on Donepezil 10 mg daily, which she discontinued shortly after, as she felt that "was doing nothing ".  This patient is accompanied in the office by her son who supplements the history.  Previous records as well as any outside records available were reviewed prior to todays visit.  In today's visit, the patient feels that her memory is "very good ".  Her son politely disagrees, stating that she has difficulty with short-term memory or "some major life events that she should remember ".  She denies repeating the same stories or asking the same questions.  Her mood is reported to be "fine ".  She has a history of  depression after her husband died about 3 years ago.  Currently, she lives with her other son.  During the day, she likes to walk her dog, do crossword puzzles and word finding.  She likes to watch TV often.  She reports sleeping well, without vivid dreams, sleepwalking, hallucinations or paranoia.  She denies leaving objects in unusual places, but has misplacing the phone or the keys and quickly finding them.  The patient is independent of bathing and dressing.  The patient still in charge of her finances with her son supervision.  Her appetite is good, denies trouble swallowing, and sometimes she cooks.  She denies leaving the stove or the faucet on.  She ambulates daily without difficulty, without the use of a walker or a cane, denies any falls or head injuries.  The patient drives and denies getting lost.  She drives short distances, to places that they are familiar.  The patient denies any headaches, double vision, dizziness, focal numbness or tingling, unilateral weakness or tremors.  She has a history of urinary issues due to neobladder, which is followed by urology.  She has frequent UTIs.  She denies constipation, diarrhea or anosmia.  Labs 10/29/2020 Total cholesterol 239, LDL 152, non-HDL 179, ALT elevated.  Triglycerides and HDL be normal.  MRI brain 12/13/20  1. No acute intracranial abnormality. 2. Moderate cerebral atrophy. 3. Mild chronic small vessel ischemic disease.     Initial Evaluation 11/08/20 The patient is seen in neurologic consultation at the request of Alroy Dust, L.Marlou Sa, MD  for the evaluation of memory.  The patient is here alone, and when asked if there is anyone that we could talk to on the phone, she said "no".  History is very limited.  This is a 81 year old woman, who denies having any memory issues.  Apparently, she was seen at the office of her primary care physician 1 year ago,at which time she had an MMSE, as her son was reporting that the patient was having difficulties  recalling.  This yielded a score of 28/30.  During the last visit to her PCP, her son continued to report worsening of her memory, which she denied.  This scored 24/30, equals an MoCA of 18.  She states that her memory is "very good ". Her son states that she had difficulty remembering even "major life events that she should remember ".  In the past, she was taking Prevagen, without any improvement.   She denies repeating the same stories or asking the same questions.  When asked about her mood she reports "fine".  Per chart report, her son states that her husband died about 3 years ago, with associated depression, she is on medications for that.   Of note, the patient does not elaborate, only answers to questions yes or no.  She likes crossword puzzles, and word fining.  She watches TV often.  When asked if she sleeps well, she agrees.  She denies any vivid dreams or sleepwalking, hallucinations or paranoia.  "Sometimes ", she leaves objects but not in unusual places.  She is independent of bathing and dressing.  When I ask about missing medications, she denies it, although she states that takes it "every other day ".-Some of the medications are prescribed daily, and some of them every 8 hours as needed. It is unclear who is in charge of the finances, when asked that question, the patient reports "not support from what ever ".  Appetite is good, denies trouble swallowing.  She cooks "sometimes ".  Denies leaving the stove or the faucet on.  She ambulates daily without difficulty, without the use of a walker or a cane.  She denies any falls or head injuries.  She reports that she drives, and denies getting lost.  Denies headaches, double vision, dizziness, focal numbness or tingling, unilateral weakness or tremors.  She has a history of urinary issues in view of neobladder, followed by urology, with frequent UTIs, last in August of this year.  Denies constipation or diarrhea, or anosmia.  Denies a history of sleep  apnea, alcohol or tobacco.  No family history of dementia.   Labs 10/29/2020 Total cholesterol 239, LDL 152, non-HDL 179, ALT elevated.  Triglycerides and HDL be normal.    PREVIOUS MEDICATIONS:   CURRENT MEDICATIONS:  Outpatient Encounter Medications as of 01/29/2021  Medication Sig   calcium carbonate (OSCAL) 1500 (600 Ca) MG TABS tablet Take 2 tablets by mouth 2 (two) times daily with a meal.   citalopram (CELEXA) 10 MG tablet Take 10 mg by mouth daily.   clonazePAM (KLONOPIN) 0.5 MG tablet Take 0.5 mg by mouth daily as needed for anxiety (anxiety).  (Patient not taking: Reported on 11/08/2020)   dicyclomine (BENTYL) 10 MG capsule Take 1 capsule (10 mg total) by mouth 2 times daily at 12 noon and 4 pm.   donepezil (ARICEPT) 10 MG tablet Take half tablet (5 mg) daily for 2 weeks, then increase to the full tablet at 10 mg daily   metoprolol succinate (TOPROL-XL) 50 MG 24  hr tablet Take 50 mg by mouth daily.   nitrofurantoin (MACRODANTIN) 50 MG capsule Take 50 mg by mouth See admin instructions. Every 3 to 4 days. (Patient not taking: Reported on 11/08/2020)   nitrofurantoin, macrocrystal-monohydrate, (MACROBID) 100 MG capsule Take twice daily for 10 days then take one nightly thereafter until completed. (Patient not taking: Reported on 11/08/2020)   Omega-3 Fatty Acids (FISH OIL PO) Take 1 tablet by mouth as needed. Occasionally - when she remembers   ondansetron (ZOFRAN) 8 MG tablet Take 1 tablet (8 mg total) by mouth every 8 (eight) hours as needed for nausea or vomiting. (Patient not taking: Reported on 11/08/2020)   pantoprazole (PROTONIX) 40 MG tablet Take 1 tablet (40 mg total) by mouth daily. (Patient not taking: Reported on 11/08/2020)   Facility-Administered Encounter Medications as of 01/29/2021  Medication   0.9 %  sodium chloride infusion     Objective:     PHYSICAL EXAMINATION:    VITALS:   Vitals:   01/29/21 1532  Pulse: (!) 106  Resp: 20  SpO2: 96%  Weight: 146 lb  (66.2 kg)    GEN:  The patient appears stated age and is in NAD. HEENT:  Normocephalic, atraumatic.   Neurological examination:  General: NAD, well-groomed, appears stated age. Orientation: The patient is alert. Oriented to person, place and date Cranial nerves: There is good facial symmetry.The speech is fluent and clear. No aphasia or dysarthria. Fund of knowledge is appropriate. Recent and remote memory are impaired. Attention and concentration are normal.  Able to name objects and repeat phrases.  Hearing is intact to conversational tone.    Sensation: Sensation is intact to light touch throughout Motor: Strength is at least antigravity x4. Tremors: none  DTR's 2/4 in [place] Cognitive Assessment  11/08/2020  Visuospatial/ Executive (0/5) 0  Naming (0/3) 3  Attention: Read list of digits (0/2) 2  Attention: Read list of letters (0/1) 1  Attention: Serial 7 subtraction starting at 100 (0/3) 0  Language: Repeat phrase (0/2) 0  Language : Fluency (0/1) 0  Abstraction (0/2) 1  Delayed Recall (0/5) 0  Orientation (0/6) 5  Total 12  Adjusted Score (based on education) 12   No flowsheet data found.  No flowsheet data found.     Movement examination: Tone: There is normal tone in the UE/LE Abnormal movements:  no tremor.  No myoclonus.  No asterixis.   Coordination:  There is no decremation with RAM's. Normal finger to nose  Gait and Station: The patient has no difficulty arising out of a deep-seated chair without the use of the hands. The patient's stride length is good.  Gait is cautious and narrow.     Total time spent on today's visit was 60 minutes, including both face-to-face time and nonface-to-face time. Time included that spent on review of records (prior notes available to me/labs/imaging if pertinent), discussing treatment and goals, answering patient's questions and coordinating care.  Cc:  Alroy Dust, L.Marlou Sa, MD Sharene Butters, PA-C

## 2021-02-11 DIAGNOSIS — R3914 Feeling of incomplete bladder emptying: Secondary | ICD-10-CM | POA: Diagnosis not present

## 2021-02-12 DIAGNOSIS — R300 Dysuria: Secondary | ICD-10-CM | POA: Diagnosis not present

## 2021-02-26 ENCOUNTER — Encounter: Payer: Self-pay | Admitting: Physician Assistant

## 2021-03-15 ENCOUNTER — Encounter: Payer: Self-pay | Admitting: Physician Assistant

## 2021-03-25 DIAGNOSIS — R3914 Feeling of incomplete bladder emptying: Secondary | ICD-10-CM | POA: Diagnosis not present

## 2021-04-25 DIAGNOSIS — R3914 Feeling of incomplete bladder emptying: Secondary | ICD-10-CM | POA: Diagnosis not present

## 2021-04-26 DIAGNOSIS — R634 Abnormal weight loss: Secondary | ICD-10-CM | POA: Diagnosis not present

## 2021-04-26 DIAGNOSIS — E782 Mixed hyperlipidemia: Secondary | ICD-10-CM | POA: Diagnosis not present

## 2021-04-26 DIAGNOSIS — R002 Palpitations: Secondary | ICD-10-CM | POA: Diagnosis not present

## 2021-04-26 DIAGNOSIS — R8290 Unspecified abnormal findings in urine: Secondary | ICD-10-CM | POA: Diagnosis not present

## 2021-04-26 DIAGNOSIS — Z0000 Encounter for general adult medical examination without abnormal findings: Secondary | ICD-10-CM | POA: Diagnosis not present

## 2021-04-26 DIAGNOSIS — I10 Essential (primary) hypertension: Secondary | ICD-10-CM | POA: Diagnosis not present

## 2021-04-26 DIAGNOSIS — F411 Generalized anxiety disorder: Secondary | ICD-10-CM | POA: Diagnosis not present

## 2021-04-26 DIAGNOSIS — R413 Other amnesia: Secondary | ICD-10-CM | POA: Diagnosis not present

## 2021-05-01 ENCOUNTER — Ambulatory Visit: Payer: PPO | Admitting: Psychology

## 2021-05-01 ENCOUNTER — Ambulatory Visit (INDEPENDENT_AMBULATORY_CARE_PROVIDER_SITE_OTHER): Payer: PPO | Admitting: Psychology

## 2021-05-01 ENCOUNTER — Encounter: Payer: Self-pay | Admitting: Psychology

## 2021-05-01 DIAGNOSIS — F0280 Dementia in other diseases classified elsewhere without behavioral disturbance: Secondary | ICD-10-CM

## 2021-05-01 DIAGNOSIS — G4700 Insomnia, unspecified: Secondary | ICD-10-CM | POA: Insufficient documentation

## 2021-05-01 DIAGNOSIS — J309 Allergic rhinitis, unspecified: Secondary | ICD-10-CM | POA: Insufficient documentation

## 2021-05-01 DIAGNOSIS — I471 Supraventricular tachycardia, unspecified: Secondary | ICD-10-CM | POA: Insufficient documentation

## 2021-05-01 DIAGNOSIS — G309 Alzheimer's disease, unspecified: Secondary | ICD-10-CM | POA: Diagnosis not present

## 2021-05-01 DIAGNOSIS — K5900 Constipation, unspecified: Secondary | ICD-10-CM | POA: Insufficient documentation

## 2021-05-01 DIAGNOSIS — I10 Essential (primary) hypertension: Secondary | ICD-10-CM | POA: Insufficient documentation

## 2021-05-01 DIAGNOSIS — F411 Generalized anxiety disorder: Secondary | ICD-10-CM | POA: Insufficient documentation

## 2021-05-01 DIAGNOSIS — E7800 Pure hypercholesterolemia, unspecified: Secondary | ICD-10-CM | POA: Insufficient documentation

## 2021-05-01 DIAGNOSIS — R4189 Other symptoms and signs involving cognitive functions and awareness: Secondary | ICD-10-CM

## 2021-05-01 DIAGNOSIS — E782 Mixed hyperlipidemia: Secondary | ICD-10-CM | POA: Insufficient documentation

## 2021-05-01 DIAGNOSIS — R002 Palpitations: Secondary | ICD-10-CM | POA: Insufficient documentation

## 2021-05-01 NOTE — Progress Notes (Signed)
? ?  Psychometrician Note ?  ?Cognitive testing was administered to [name] by Milana Kidney, B.S. (psychometrist) under the supervision of Dr. Christia Reading, Ph.D., licensed psychologist on 05/01/2021. [name] did not appear overtly distressed by the testing session per behavioral observation or responses across self-report questionnaires. Rest breaks were offered.  ?  ?The battery of tests administered was selected by Dr. Christia Reading, Ph.D. with consideration to [name]'s current level of functioning, the nature of her symptoms, emotional and behavioral responses during interview, level of literacy, observed level of motivation/effort, and the nature of the referral question. This battery was communicated to the psychometrist. Communication between Dr. Christia Reading, Ph.D. and the psychometrist was ongoing throughout the evaluation and Dr. Christia Reading, Ph.D. was immediately accessible at all times. Dr. Christia Reading, Ph.D. provided supervision to the psychometrist on the date of this service to the extent necessary to assure the quality of all services provided.  ?  ?[name] will return within approximately 1-2 weeks for an interactive feedback session with Dr. Melvyn Novas at which time her test performances, clinical impressions, and treatment recommendations will be reviewed in detail. [name] understands she can contact our office should she require our assistance before this time. ? ?A total of 95 minutes of billable time were spent face-to-face with [name] by the psychometrist. This includes both test administration and scoring time. Billing for these services is reflected in the clinical report generated by Dr. Christia Reading, Ph.D. ? ?This note reflects time spent with the psychometrician and does not include test scores or any clinical interpretations made by Dr. Melvyn Novas. The full report will follow in a separate note.  ?

## 2021-05-01 NOTE — Progress Notes (Addendum)
? ?NEUROPSYCHOLOGICAL EVALUATION ?[facility] ?[place] Department of Neurology ? ?Date of Evaluation: May 01, 2021 ? ?Reason for Referral:  ? ?[name] is a 81 y.o. right-handed Caucasian female referred by  Sharene Butters, PA-C , to characterize her current cognitive functioning and assist with diagnostic clarity and treatment planning in the context of subjective cognitive decline and concern for the presence of a neurodegenerative illness.  ? ?Assessment and Plan:  ? ?Clinical Impression(s): ?[name]'s pattern of performance is suggestive of diffuse and quite severe impairment. Unfortunately, there were no areas which suggested appropriate performance when compared to age-matched peers. Impairments were seen across all assessed cognitive domains. This includes processing speed, attention/concentration, cognitive flexibility, safety/judgment, receptive and expressive language, visuospatial abilities, and all aspects of learning and memory. I do not believe that [name] has appropriate insight into her current cognitive and functional limitations. Based upon test scores, she certainly meets diagnostic criteria for a Major Neurocognitive Disorder ("dementia") at the present time. ? ?Given diffuse, severe impairment with no discernible pattern of strength and weakness, the etiology of ongoing impairment is difficult to confidently state. Alzheimer's disease remains a prime candidate. [name] did not benefit from repeated learning trials, was essentially amnestic across all memory tasks after a short delay, and performed poorly across yes/no recognition trials. This suggests the presence of rapid forgetting and a severe memory storage deficit, both of which are the hallmark memory characteristics of this illness. As Alzheimer's disease progresses, eventually it will affect all cognitive domains and current testing could suggest that [name] is in moderate stages at the present  time. Given cognitive impairment and outward language dysfunction, I cannot rule out a primary progressive aphasia (PPA) presentation. However, Ms. Bendavid's age is advanced compared to when PPA presentations typically present and recent imaging did not suggest advanced frontal or temporal lobe atrophy. Additionally, her son's reported timeline of memory decline early-on followed by broad progressive decline is more consistent with Alzheimer's disease, making a PPA presentation less likely. She and her son did not report behavioral characteristics of Lewy body dementia, the behavioral variant of frontotemporal dementia, or a more rare parkinsonian condition. Recent neuroimaging also did not suggest significant cerebrovascular changes or prior stroke, making a vascular dementia presentation unlikely. As such, dementia due to Alzheimer's disease remains as the most likely culprit at the present time.  ? ?I feel it is important to highlight performances across tasks designed to assess safety and judgment more in-depth. [name] exhibited significant impairment across both these tasks. She was unable to provide reasonable or safe answers to fairly basic questions such as the importance of blowing out candles before going to bed, not leaving a young child home alone, Solomon she would respond when receiving a likely telephone scam call, Solomon she would respond if she took too much of a medication, or Solomon she would respond if she went on vacation without her medications. When asked during interview, she was unable to name any of her current medications, roughly Solomon many pills she takes on a daily basis, or name any prior medical diagnoses or ailments. Based upon this, I have concerns surrounding [name]'s ability to understand complex medical and financial decision making processes and it may be that she lacks complete competence to make these decisions independently. However, this decision would need to be made in a legal  arena and by medical professionals who know [name] better and have treated her more regularly.  ? ?  Recommendations: ?I would strongly advocate for her sons to become more involved in medication management, financial management, and bill paying responsibilities (including fully taking these over if they deem it necessary). Current test scores would suggest a high likelihood for poor, albeit unintentional, medication adherence (i.e., due to forgetting to take medications) or forgetting to pay an unexpected or irregularly scheduled bill.  ? ?Admittedly, performance across neurocognitive testing is not a strong predictor of an individual's safety operating a motor vehicle. However, I have concerns surrounding her ability to safely operate her vehicle based upon diffuse cognitive impairment. Impairments surrounding processing speed/reaction time, multi-tasking, and visuospatial abilities are particularly concerning. Memory impairment would increase her risk of getting lost, especially if her normal route is disrupted. Should her family wish to pursue a formalized driving evaluation, they could reach out to the following agencies: ?[employer] in [place]: [phone] ?[facility]: [phone] ?[facility]: [phone] ?[facility]: [phone] or [phone] ? ?While [name] currently lives with one of her sons, she is often home alone during typical working hours. I would encourage her sons to examine and utilize insurance benefits (if present) which could provide at-home care for the hours she would typically be alone. I would also advise her sons to tour community based assisted living facilities so that they have a plan in place should they feel that they are unable to give [name] the ongoing care and monitoring necessary. Should they doubt their abilities, that is often the best indicator that the threshold has been reached where a transition to  assisted living or memory care is necessary. ? ?[name] has been prescribed a medication aimed to address memory loss and concerns surrounding Alzheimer's disease (i.e., donepezil/Aricept). However, medical records raise concerns that she self-discontinued this medication. She is encouraged to continue taking this medication as prescribed. It is important to highlight that this medication has been shown to slow functional decline in some individuals. There is no current treatment which can stop or reverse cognitive decline when caused by a neurodegenerative illness.  ? ?[name] is encouraged to attend to lifestyle factors for brain health (e.g., regular physical exercise, good nutrition habits, regular participation in cognitively-stimulating activities, and general stress management techniques), which are likely to have benefits for both emotional adjustment and cognition. Optimal control of vascular risk factors (including safe cardiovascular exercise and adherence to dietary recommendations) is encouraged. Continued participation in activities which provide mental stimulation and social interaction is also recommended.  ? ?Important information should be provided to [name] in written format in all instances. This information should be placed in a highly frequented and easily visible location within her home to promote recall. External strategies such as written notes in a consistently used memory journal, visual and nonverbal auditory cues such as a calendar on the refrigerator or appointments with alarm, such as on a cell phone, can also help maximize recall. ? ?Review of Records:  ? ?[name] was most recently seen by [facility] Sharene Butters, PA-C) on 01/29/2021 for continued care of memory impairment. Performance on a brief cognitive screening instrument [facility]) was 12/30 during her prior appointment with [name] in October 2022. This evaluation was prompted by her PCP Donnie Coffin,  M.D.) after he observed a decline year over year on a different but similar cognitive screening task (MMSE; 28/30 to 24/30). During her prior visit with [name], [name] was alone and noted to be a poor his

## 2021-05-09 ENCOUNTER — Encounter: Payer: PPO | Admitting: Psychology

## 2021-05-13 ENCOUNTER — Ambulatory Visit (INDEPENDENT_AMBULATORY_CARE_PROVIDER_SITE_OTHER): Payer: PPO | Admitting: Psychology

## 2021-05-13 DIAGNOSIS — G309 Alzheimer's disease, unspecified: Secondary | ICD-10-CM

## 2021-05-13 DIAGNOSIS — F0280 Dementia in other diseases classified elsewhere without behavioral disturbance: Secondary | ICD-10-CM

## 2021-05-13 NOTE — Progress Notes (Signed)
? ?  Neuropsychology Feedback Session ?[place] ?[place] Department of Neurology ? ?Reason for Referral:  ? ?[name] is a 81 y.o. right-handed Caucasian female referred by  Sharene Butters, PA-C , to characterize her current cognitive functioning and assist with diagnostic clarity and treatment planning in the context of subjective cognitive decline and concern for the presence of a neurodegenerative illness.  ? ?Feedback:  ? ?[name] completed a comprehensive neuropsychological evaluation on 05/01/2021. Please refer to that encounter for the full report and recommendations. Briefly, results suggested diffuse and quite severe impairment. Unfortunately, there were no areas which suggested appropriate performance when compared to age-matched peers. Impairments were seen across all assessed cognitive domains. Given diffuse, severe impairment with no discernible pattern of strength and weakness, the etiology of ongoing impairment is difficult to confidently state. Alzheimer's disease remains a prime candidate. [name] did not benefit from repeated learning trials, was essentially amnestic across all memory tasks after a short delay, and performed poorly across yes/no recognition trials. This suggests the presence of rapid forgetting and a severe memory storage deficit, both of which are the hallmark memory characteristics of this illness. As Alzheimer's disease progresses, eventually it will affect all cognitive domains and current testing could suggest that [name] is in moderate stages at the present time. Given cognitive impairment and outward language dysfunction, I cannot rule out a primary progressive aphasia (PPA) presentation. However, [name]'s age is advanced compared to when PPA presentations typically present and recent imaging did not suggest advanced frontal or temporal lobe atrophy. Additionally, her son's reported timeline of memory decline early-on followed by broad  progressive decline is more consistent with Alzheimer's disease, making a PPA presentation less likely. She and her son did not report behavioral characteristics of Lewy body dementia, the behavioral variant of frontotemporal dementia, or a more rare parkinsonian condition. Recent neuroimaging also did not suggest significant cerebrovascular changes or prior stroke, making a vascular dementia presentation unlikely. As such, dementia due to Alzheimer's disease remains as the most likely culprit at the present time.  ? ?Due to [name]'s degree of cognitive impairment, her feedback appointment was conducted with her son [name]. This was previously authorized by [name] via her signing a past DPR. [name] son [name] was unaccompanied during the current telephone call. He was within his residence while I was within my office. I discussed the limitations of evaluation and management by telemedicine and the availability of in person appointments. [name] expressed his understanding and agreed to proceed. Content of the current session focused on the results of his mother's neuropsychological evaluation. [name] was given the opportunity to ask questions and his questions were answered. He was encouraged to reach out should additional questions arise. [name] chart is available for her and her family to review on MyChart.  ? ?  ? ?25 minutes were spent conducting the current feedback session with [name]. No billing was conducted as [name] was not apart of the current feedback appointment.  ?

## 2021-05-21 DIAGNOSIS — R3914 Feeling of incomplete bladder emptying: Secondary | ICD-10-CM | POA: Diagnosis not present

## 2021-05-28 ENCOUNTER — Ambulatory Visit: Payer: PPO | Admitting: Physician Assistant

## 2021-06-28 ENCOUNTER — Ambulatory Visit: Payer: PPO | Admitting: Physician Assistant

## 2021-09-11 DIAGNOSIS — S60414A Abrasion of right ring finger, initial encounter: Secondary | ICD-10-CM | POA: Diagnosis not present

## 2021-09-11 DIAGNOSIS — W19XXXA Unspecified fall, initial encounter: Secondary | ICD-10-CM | POA: Diagnosis not present

## 2021-09-11 DIAGNOSIS — S60416A Abrasion of right little finger, initial encounter: Secondary | ICD-10-CM | POA: Diagnosis not present

## 2021-09-11 DIAGNOSIS — S61411A Laceration without foreign body of right hand, initial encounter: Secondary | ICD-10-CM | POA: Diagnosis not present

## 2021-09-11 DIAGNOSIS — S60811A Abrasion of right wrist, initial encounter: Secondary | ICD-10-CM | POA: Diagnosis not present

## 2021-09-11 DIAGNOSIS — S60511A Abrasion of right hand, initial encounter: Secondary | ICD-10-CM | POA: Diagnosis not present

## 2021-09-18 DIAGNOSIS — L290 Pruritus ani: Secondary | ICD-10-CM | POA: Diagnosis not present

## 2021-09-22 DIAGNOSIS — Z4802 Encounter for removal of sutures: Secondary | ICD-10-CM | POA: Diagnosis not present

## 2021-10-25 DIAGNOSIS — I10 Essential (primary) hypertension: Secondary | ICD-10-CM | POA: Diagnosis not present

## 2021-10-25 DIAGNOSIS — R002 Palpitations: Secondary | ICD-10-CM | POA: Diagnosis not present

## 2021-10-25 DIAGNOSIS — R413 Other amnesia: Secondary | ICD-10-CM | POA: Diagnosis not present

## 2021-10-25 DIAGNOSIS — F411 Generalized anxiety disorder: Secondary | ICD-10-CM | POA: Diagnosis not present

## 2021-10-25 DIAGNOSIS — E782 Mixed hyperlipidemia: Secondary | ICD-10-CM | POA: Diagnosis not present

## 2021-10-25 DIAGNOSIS — Z23 Encounter for immunization: Secondary | ICD-10-CM | POA: Diagnosis not present

## 2021-10-25 DIAGNOSIS — Z111 Encounter for screening for respiratory tuberculosis: Secondary | ICD-10-CM | POA: Diagnosis not present

## 2021-10-31 ENCOUNTER — Telehealth: Payer: Self-pay | Admitting: Anesthesiology

## 2021-10-31 MED ORDER — DONEPEZIL HCL 10 MG PO TABS: ORAL_TABLET | ORAL | 0 refills | Status: DC

## 2021-10-31 NOTE — Telephone Encounter (Signed)
Refill sent in for pt. 

## 2021-10-31 NOTE — Telephone Encounter (Signed)
Patient son sch for 12-05-21 with Clarise Cruz and he would like to have it sent in to [facility] on [address]

## 2021-10-31 NOTE — Telephone Encounter (Signed)
Patient's primary care doctor's office called requesting a medication refill for patient's  Aricept. Prescription is to be sent to [facility] at [phone] ask for [facility].

## 2021-11-08 DIAGNOSIS — B354 Tinea corporis: Secondary | ICD-10-CM | POA: Diagnosis not present

## 2021-12-05 ENCOUNTER — Encounter: Payer: Self-pay | Admitting: Physician Assistant

## 2021-12-05 ENCOUNTER — Ambulatory Visit: Payer: PPO | Admitting: Physician Assistant

## 2021-12-05 VITALS — BP 150/62 | HR 78 | Resp 20 | Wt 136.0 lb

## 2021-12-05 DIAGNOSIS — G309 Alzheimer's disease, unspecified: Secondary | ICD-10-CM

## 2021-12-05 DIAGNOSIS — F0280 Dementia in other diseases classified elsewhere without behavioral disturbance: Secondary | ICD-10-CM | POA: Diagnosis not present

## 2021-12-05 NOTE — Progress Notes (Signed)
Assessment/Plan:   Dementia likely due to Alzheimer's disease without behavioral disturbance  [name] is a very pleasant 81 y.o. RH female with a history of   irritable bowel syndrome, fibromyalgia, status post cholecystectomy and neobladder creation, followed by urology,  anxiety, depression dementia likely due to Alzheimer's disease seen today in follow up for memory loss. Patient is currently on donepezil 10 mg daily. Neuropsych evaluation in 04/2021 confirmed decline in multiple areas of testing. In todays's visit, cognitive decline is noted. At this point, there is no therapeutic advantage of adding any other antidementia medication in view of significant cognitive decline, as the risks will outweigh the benefits. Son is in agreement .      Follow up in 6  months. Son agrees to discontinue donepezil as there is no therapeutic benefit  Agree with ALF, and if continues to progress, recommend memory care , as [name] needs 24/7 monitoring Continue to control mood as per PCP Recommend good control of cardiovascular risk factors.       Subjective:    This patient is accompanied in the office by her son who provides the history ans patient is unable to elaborate due to cognitive decline.  Previous records as well as any outside records available were reviewed prior to todays visit.  [name] was last seen on 01/29/2021    Any changes in memory since last visit? He reports significant cognitive decline, [name] is less fluent than prior, answering mostly yes, no, and [name] cannot answer " simple questions. [name] repeats the words said by other people, and has difficulties with names. [name] cannot recognize her husband in a picture. [name] still recognizes her sone because he sees her often . [name] is in ALF as [name] needs 24/7 care,  and likes to play Bingo there. Her son still picks her up ant takes her to eat, do her nails, take her to her appointments such as mammograms, etc.  repeats oneself?  Denies   Disoriented when walking into a room?  Patient denies   Leaving objects in unusual places? Endorsed, [name] may take a lollipop and place it in a clothes hanger, dirty underwear in the hamper  Ambulates  with difficulty?   Patient denies   Recent falls?  Patient denies   Any head injuries?  Patient denies   History of seizures?   Patient denies   Wandering behavior?  Patient denies   Patient drives?   Patient no longer drives  Any mood changes since last visit?  Patient denies   Any worsening depression?:  Patient denies   Hallucinations?  Patient denies   Paranoia?  Patient denies   Patient reports that sleeps well without vivid dreams, REM behavior or sleepwalking. Melatonin helps    History of sleep apnea?  Patient denies   Any hygiene concerns?  Help comes regularly for bathing and dressing her   Independent of bathing and dressing? [name] fights to put the pajamas on  Does the patient needs help with medications? Son  in charge  Who is in charge of the finances?  Son is in charge   Any changes in appetite?   Patient denies   Patient have trouble swallowing? Patient denies   Does the patient cook?  Patient denies   Any kitchen accidents such as leaving the stove on? Patient denies   Any headaches?  Patient denies   Double vision? Patient denies   Any focal numbness or tingling?  Patient denies   Chronic back  pain Patient denies   Unilateral weakness?  Patient denies   Any tremors?  Patient denies   Any history of anosmia?  Patient denies   Any incontinence of urine?  Patient denies   Any bowel dysfunction?   Patient denies      Patient lives with: Assisted living since  10/1   Neuropsych evaluation Dr. Melvyn Novas 04/2021 Briefly, results suggested diffuse and quite severe impairment. Unfortunately, there were no areas which suggested appropriate performance when compared to age-matched peers. Impairments were seen across all assessed cognitive domains. Given diffuse, severe impairment with  no discernible pattern of strength and weakness, the etiology of ongoing impairment is difficult to confidently state. Alzheimer's disease remains a prime candidate. [name] did not benefit from repeated learning trials, was essentially amnestic across all memory tasks after a short delay, and performed poorly across yes/no recognition trials. This suggests the presence of rapid forgetting and a severe memory storage deficit, both of which are the hallmark memory characteristics of this illness. As Alzheimer's disease progresses, eventually it will affect all cognitive domains and current testing could suggest that [name] is in moderate stages at the present time. Given cognitive impairment and outward language dysfunction, I cannot rule out a primary progressive aphasia (PPA) presentation. However, [name] age is advanced compared to when PPA presentations typically present and recent imaging did not suggest advanced frontal or temporal lobe atrophy. Additionally, her son's reported timeline of memory decline early-on followed by broad progressive decline is more consistent with Alzheimer's disease, making a PPA presentation less likely. [name] and her son did not report behavioral characteristics of Lewy body dementia, the behavioral variant of frontotemporal dementia, or a more rare parkinsonian condition. Recent neuroimaging also did not suggest significant cerebrovascular changes or prior stroke, making a vascular dementia presentation unlikely. As such, dementia due to Alzheimer's disease remains as the most likely culprit at the present time.         Initial visit 10/13//22 The patient is seen in neurologic consultation at the request of Alroy Dust, L.Marlou Sa, MD for the evaluation of memory.  The patient is here alone, and when asked if there is anyone that we could talk to on the phone, [name] said "no".  History is very limited.  This is a 81 year old woman, who denies having any memory issues.   Apparently, [name] was seen at the office of her primary care physician 1 year ago,at which time [name] had an MMSE, as her son was reporting that the patient was having difficulties recalling.  This yielded a score of 28/30.  During the last visit to her PCP, her son continued to report worsening of her memory, which [name] denied.  This scored 24/30, equals an MoCA of 18.  [name] states that her memory is "very good ". Her son states that [name] had difficulty remembering even "major life events that [name] should remember ".  In the past, [name] was taking Prevagen, without any improvement.   [name] denies repeating the same stories or asking the same questions.  When asked about her mood [name] reports "fine".  Per chart report, her son states that her husband died about 3 years ago, with associated depression, [name] is on medications for that.   Of note, the patient does not elaborate, only answers to questions yes or no.  [name] likes crossword puzzles, and word fining.  [name] watches TV often.  When asked if [name] sleeps well, [name] agrees.  [name] denies any vivid dreams or  sleepwalking, hallucinations or paranoia.  "Sometimes ", [name] leaves objects but not in unusual places.  [name] is independent of bathing and dressing.  When I ask about missing medications, [name] denies it, although [name] states that takes it "every other day ".-Some of the medications are prescribed daily, and some of them every 8 hours as needed. It is unclear who is in charge of the finances, when asked that question, the patient reports "not support from what ever ".  Appetite is good, denies trouble swallowing.  [name] cooks "sometimes ".  Denies leaving the stove or the faucet on.  [name] ambulates daily without difficulty, without the use of a walker or a cane.  [name] denies any falls or head injuries.  [name] reports that [name] drives, and denies getting lost.  Denies headaches, double vision, dizziness, focal numbness or tingling, unilateral weakness or tremors.  [name] has a history of  urinary issues in view of neobladder, followed by urology, with frequent UTIs, last in August of this year.  Denies constipation or diarrhea, or anosmia.  Denies a history of sleep apnea, alcohol or tobacco.  No family history of dementia.   Labs 10/29/2020 Total cholesterol 239, LDL 152, non-HDL 179, ALT elevated.  Triglycerides and HDL be normal.  PREVIOUS MEDICATIONS:   CURRENT MEDICATIONS:  Outpatient Encounter Medications as of 12/05/2021  Medication Sig   calcium carbonate (OSCAL) 1500 (600 Ca) MG TABS tablet Take 2 tablets by mouth 2 (two) times daily with a meal.   citalopram (CELEXA) 10 MG tablet Take 10 mg by mouth daily.   clonazePAM (KLONOPIN) 0.5 MG tablet Take 0.5 mg by mouth daily as needed for anxiety (anxiety).   dicyclomine (BENTYL) 10 MG capsule Take 1 capsule (10 mg total) by mouth 2 times daily at 12 noon and 4 pm.   donepezil (ARICEPT) 10 MG tablet Take  1 tablet at 10 mg daily   metoprolol succinate (TOPROL-XL) 50 MG 24 hr tablet Take 50 mg by mouth daily.   nitrofurantoin (MACRODANTIN) 50 MG capsule Take 50 mg by mouth See admin instructions. Every 3 to 4 days.   nitrofurantoin, macrocrystal-monohydrate, (MACROBID) 100 MG capsule    Omega-3 Fatty Acids (FISH OIL PO) Take 1 tablet by mouth as needed. Occasionally - when [name] remembers   ondansetron (ZOFRAN) 8 MG tablet Take 1 tablet (8 mg total) by mouth every 8 (eight) hours as needed for nausea or vomiting.   pantoprazole (PROTONIX) 40 MG tablet Take 1 tablet (40 mg total) by mouth daily.   Facility-Administered Encounter Medications as of 12/05/2021  Medication   0.9 %  sodium chloride infusion        No data to display            11/08/2020   12:00 PM  Montreal Cognitive Assessment   Visuospatial/ Executive (0/5) 0  Naming (0/3) 3  Attention: Read list of digits (0/2) 2  Attention: Read list of letters (0/1) 1  Attention: Serial 7 subtraction starting at 100 (0/3) 0  Language: Repeat phrase (0/2) 0   Language : Fluency (0/1) 0  Abstraction (0/2) 1  Delayed Recall (0/5) 0  Orientation (0/6) 5  Total 12  Adjusted Score (based on education) 12    Objective:     PHYSICAL EXAMINATION:    VITALS:   Vitals:   12/05/21 1515  BP: (!) 150/62  Pulse: 78  Resp: 20  SpO2: 95%  Weight: 136 lb (61.7 kg)    GEN:  The patient  appears stated age and is in NAD. HEENT:  Normocephalic, atraumatic.   Neurological examination:  General: NAD, well-groomed, appears stated age. Orientation: The patient is alert. Oriented to person, unable to tell place and date Cranial nerves: There is good facial symmetry.The speech is non fluent but clear. No aphasia or dysarthria. Fund of knowledge is reduced Recent and remote memory are impaired. Attention and concentration are reduced.  Unable  to name objects and repeat phrases.  Hearing is intact to conversational tone.    Motor: Strength is at least antigravity x4. Tremors: none  DTR's 2/4 in UE/LE     Movement examination: Tone: There is normal tone in the UE/LE Abnormal movements:  no tremor.  No myoclonus.  No asterixis.   Coordination:  There is decremation with RAM's.  Unable to follow instructions such as finger to nose or pill rolling/tapping Gait and Station: The patient has no difficulty arising out of a deep-seated chair without the use of the hands. The patient's stride length is good.  Gait is cautious and narrow.    Thank you for allowing [place] the opportunity to participate in the care of this nice patient. Please do not hesitate to contact us for any questions or concerns.   Total time spent on today's visit was 34 minutes dedicated to this patient today, preparing to see patient, examining the patient, ordering tests and/or medications and counseling the patient, documenting clinical information in the EHR or other health record, independently interpreting results and communicating results to the patient/family, discussing treatment and  goals, answering patient's questions and coordinating care.  Cc:  Alroy Dust, L.Marlou Sa, MD  Sharene Butters 12/05/2021 7:22 PM

## 2021-12-05 NOTE — Patient Instructions (Signed)
It was a pleasure to see you today at our office.   Recommendations:  Follow up in 6  months  Feel free to visit Facebook page " [email or web address]" for tips of how to care for people with memory problems.   Whom to call:  Memory  decline, memory medications: Call our office [phone]   For psychiatric meds, mood meds: Please have your primary care physician manage these medications.    For assessment of decision of mental capacity and competency:  Call Dr. Anthoney Harada, geriatric psychiatrist at [phone]  For guidance in geriatric dementia issues please call [facility] [phone]  For guidance regarding [facility] and if placement were needed at the facility, contact Arnell Asal, Social Worker tel: [phone]  If you have any severe symptoms of a stroke, or other severe issues such as confusion,severe chills or fever, etc call 911 or go to the ER as you may need to be evaluated further        RECOMMENDATIONS FOR ALL PATIENTS WITH MEMORY PROBLEMS: 1. Continue to exercise (Recommend 30 minutes of walking everyday, or 3 hours every week) 2. Increase social interactions - continue going to [place] and enjoy social gatherings with friends and family 3. Eat healthy, avoid fried foods and eat more fruits and vegetables 4. Maintain adequate blood pressure, blood sugar, and blood cholesterol level. Reducing the risk of stroke and cardiovascular disease also helps promoting better memory. 5. Avoid stressful situations. Live a simple life and avoid aggravations. Organize your time and prepare for the next day in anticipation. 6. Sleep well, avoid any interruptions of sleep and avoid any distractions in the bedroom that may interfere with adequate sleep quality 7. Avoid sugar, avoid sweets as there is a strong link between excessive sugar intake, diabetes, and cognitive impairment We discussed the Mediterranean diet, which has been shown to help patients  reduce the risk of progressive memory disorders and reduces cardiovascular risk. This includes eating fish, eat fruits and green leafy vegetables, nuts like almonds and hazelnuts, walnuts, and also use olive oil. Avoid fast foods and fried foods as much as possible. Avoid sweets and sugar as sugar use has been linked to worsening of memory function.  There is always a concern of gradual progression of memory problems. If this is the case, then we may need to adjust level of care according to patient needs. Support, both to the patient and caregiver, should then be put into place.    The Alzheimer's Association is here all day, every day for people facing Alzheimer's disease through our free 24/7 Helpline: [phone]. The Helpline provides reliable information and support to all those who need assistance, such as individuals living with memory loss, Alzheimer's or other dementia, caregivers, health care professionals and the public.  Our highly trained and knowledgeable staff can help you with: Understanding memory loss, dementia and Alzheimer's  Medications and other treatment options  General information about aging and brain health  Skills to provide quality care and to find the best care from professionals  Legal, financial and living-arrangement decisions Our Helpline also features: Confidential care consultation provided by master's level clinicians who can help with decision-making support, crisis assistance and education on issues families face every day  Help in a caller's preferred language using our translation service that features more than 200 languages and dialects  Referrals to local community programs, services and ongoing support     FALL PRECAUTIONS: Be cautious when walking. Scan the area for  obstacles that may increase the risk of trips and falls. When getting up in the mornings, sit up at the edge of the bed for a few minutes before getting out of bed. Consider elevating  the bed at the head end to avoid drop of blood pressure when getting up. Walk always in a well-lit room (use night lights in the walls). Avoid area rugs or power cords from appliances in the middle of the walkways. Use a walker or a cane if necessary and consider physical therapy for balance exercise. Get your eyesight checked regularly.  FINANCIAL OVERSIGHT: Supervision, especially oversight when making financial decisions or transactions is also recommended.  HOME SAFETY: Consider the safety of the kitchen when operating appliances like stoves, microwave oven, and blender. Consider having supervision and share cooking responsibilities until no longer able to participate in those. Accidents with firearms and other hazards in the house should be identified and addressed as well.   ABILITY TO BE LEFT ALONE: If patient is unable to contact 911 operator, consider using LifeLine, or when the need is there, arrange for someone to stay with patients. Smoking is a fire hazard, consider supervision or cessation. Risk of wandering should be assessed by caregiver and if detected at any point, supervision and safe proof recommendations should be instituted.  MEDICATION SUPERVISION: Inability to self-administer medication needs to be constantly addressed. Implement a mechanism to ensure safe administration of the medications.   DRIVING: Regarding driving, in patients with progressive memory problems, driving will be impaired. We advise to have someone else do the driving if trouble finding directions or if minor accidents are reported. Independent driving assessment is available to determine safety of driving.   If you are interested in the driving assessment, you can contact the following:  [employer] in [place]  [place] ([phone] or [phone]      [place] refers to food and lifestyle choices that are based on the traditions of countries located on the [employer]. This way of eating has been shown to help prevent certain conditions and improve outcomes for people who have chronic diseases, like kidney disease and heart disease. What are tips for following this plan? Lifestyle  Cook and eat meals together with your family, when possible. Drink enough fluid to keep your urine clear or pale yellow. Be physically active every day. This includes: Aerobic exercise like running or swimming. Leisure activities like gardening, walking, or housework. Get 7-8 hours of sleep each night. If recommended by your health care provider, drink red wine in moderation. This means 1 glass a day for nonpregnant women and 2 glasses a day for men. A glass of wine equals 5 oz (150 mL). Reading food labels  Check the serving size of packaged foods. For foods such as rice and pasta, the serving size refers to the amount of cooked product, not dry. Check the total fat in packaged foods. Avoid foods that have saturated fat or trans fats. Check the ingredients list for added sugars, such as corn syrup. Shopping  At the grocery store, buy most of your food from the areas near the walls of the store. This includes: Fresh fruits and vegetables (produce). Grains, beans, nuts, and seeds. Some of these may be available in unpackaged forms or large amounts (in bulk). Fresh seafood. Poultry and eggs. Low-fat dairy products. Buy whole ingredients instead of prepackaged foods. Buy fresh fruits  and vegetables in-season from local farmers markets. Buy frozen fruits and vegetables in resealable bags. If you do not have access to quality fresh seafood, buy precooked frozen shrimp or canned fish, such as tuna, salmon, or sardines. Buy small amounts of raw or cooked vegetables, salads, or olives from the deli or salad bar at your store. Stock your pantry so you  always have certain foods on hand, such as olive oil, canned tuna, canned tomatoes, rice, pasta, and beans. Cooking  Cook foods with extra-virgin olive oil instead of using butter or other vegetable oils. Have meat as a side dish, and have vegetables or grains as your main dish. This means having meat in small portions or adding small amounts of meat to foods like pasta or stew. Use beans or vegetables instead of meat in common dishes like chili or lasagna. Experiment with different cooking methods. Try roasting or broiling vegetables instead of steaming or sauteing them. Add frozen vegetables to soups, stews, pasta, or rice. Add nuts or seeds for added healthy fat at each meal. You can add these to yogurt, salads, or vegetable dishes. Marinate fish or vegetables using olive oil, lemon juice, garlic, and fresh herbs. Meal planning  Plan to eat 1 vegetarian meal one day each week. Try to work up to 2 vegetarian meals, if possible. Eat seafood 2 or more times a week. Have healthy snacks readily available, such as: Vegetable sticks with hummus. Greek yogurt. Fruit and nut trail mix. Eat balanced meals throughout the week. This includes: Fruit: 2-3 servings a day Vegetables: 4-5 servings a day Low-fat dairy: 2 servings a day Fish, poultry, or lean meat: 1 serving a day Beans and legumes: 2 or more servings a week Nuts and seeds: 1-2 servings a day Whole grains: 6-8 servings a day Extra-virgin olive oil: 3-4 servings a day Limit red meat and sweets to only a few servings a month What are my food choices? Mediterranean diet Recommended Grains: Whole-grain pasta. Brown rice. Bulgar wheat. Polenta. Couscous. Whole-wheat bread. [place]. Vegetables: Artichokes. Beets. Broccoli. Cabbage. Carrots. Eggplant. Green beans. Chard. Kale. Spinach. Onions. Leeks. Peas. Squash. Tomatoes. Peppers. Radishes. Fruits: Apples. Apricots. Avocado. Berries. Bananas. Cherries. Dates. Figs. Grapes. Lemons.  Melon. Oranges. Peaches. Plums. Pomegranate. Meats and other protein foods: Beans. Almonds. Sunflower seeds. Pine nuts. Peanuts. [place]. Salmon. Scallops. Shrimp. [place]. Tilapia. Clams. Oysters. Eggs. Dairy: Low-fat milk. Cheese. Greek yogurt. Beverages: Water. Red wine. Herbal tea. Fats and oils: Extra virgin olive oil. Avocado oil. Grape seed oil. Sweets and desserts: [place] yogurt with honey. Baked apples. Poached pears. Trail mix. Seasoning and other foods: Basil. Cilantro. Coriander. Cumin. Mint. Parsley. Sage. Rosemary. Tarragon. Garlic. Oregano. Thyme. Pepper. Balsalmic vinegar. Tahini. Hummus. Tomato sauce. Olives. Mushrooms. Limit these Grains: Prepackaged pasta or rice dishes. Prepackaged cereal with added sugar. Vegetables: Deep fried potatoes (french fries). Fruits: Fruit canned in syrup. Meats and other protein foods: Beef. Pork. Lamb. Poultry with skin. Hot dogs. [place] Salines. Dairy: Ice cream. Sour cream. Whole milk. Beverages: Juice. Sugar-sweetened soft drinks. Beer. Liquor and spirits. Fats and oils: Butter. Canola oil. Vegetable oil. Beef fat (tallow). Lard. Sweets and desserts: Cookies. Cakes. Pies. Candy. Seasoning and other foods: Mayonnaise. Premade sauces and marinades. The items listed may not be a complete list. Talk with your dietitian about what dietary choices are right for you. Summary The Mediterranean diet includes both food and lifestyle choices. Eat a variety of fresh fruits and vegetables, beans, nuts, seeds, and whole grains. Limit the amount of red  meat and sweets that you eat. Talk with your health care provider about whether it is safe for you to drink red wine in moderation. This means 1 glass a day for nonpregnant women and 2 glasses a day for men. A glass of wine equals 5 oz (150 mL). This information is not intended to replace advice given to you by your health care provider. Make sure you discuss any questions you have with your health care provider. Document  Released: 09/06/2015 Document Revised: 10/09/2015 Document Reviewed: 09/06/2015 Elsevier Interactive Patient Education  2017 [employer].

## 2021-12-07 DIAGNOSIS — Z1231 Encounter for screening mammogram for malignant neoplasm of breast: Secondary | ICD-10-CM | POA: Diagnosis not present

## 2022-02-04 ENCOUNTER — Encounter: Payer: Self-pay | Admitting: Physician Assistant

## 2022-02-04 NOTE — Progress Notes (Signed)
[name] is a very pleasant 82 y.o. RH female with dementia likely due to Alzheimer's disease . During her last visit cognitive decline was noted. At this point, there is no therapeutic advantage of adding any other antidementia medication in view of significant cognitive decline, as the risks will outweigh the benefits. Son was in agreement .  Recommend discontinuing antidementia medications at ALF, that is Donepezil . Continue to control mood as per PCP Thanks

## 2022-02-04 NOTE — Progress Notes (Unsigned)
Subjective:    Patient ID: [name] is a 82 y.o. female.  HPI {Common ambulatory SmartLinks:19316}  Review of Systems  Objective:  Neurological Exam  Physical Exam  Assessment:   ***  Plan:   ***

## 2022-02-04 NOTE — Telephone Encounter (Signed)
Will be glad to write it . Thanks

## 2022-02-05 ENCOUNTER — Telehealth: Payer: Self-pay | Admitting: Physician Assistant

## 2022-02-05 NOTE — Telephone Encounter (Signed)
Pt's son called in stating there was supposed to be a letter mailed out for the pt, but he was wanting to see if a copy can be faxed to her assisted living facility as well? Their fax number is [phone]. Daylene Katayama

## 2022-02-05 NOTE — Telephone Encounter (Signed)
Faxing it now.

## 2022-03-27 ENCOUNTER — Encounter: Payer: Self-pay | Admitting: Physician Assistant

## 2022-04-21 ENCOUNTER — Ambulatory Visit: Payer: PPO | Admitting: Physician Assistant

## 2022-05-01 ENCOUNTER — Ambulatory Visit: Payer: PPO | Admitting: Physician Assistant

## 2022-05-02 DIAGNOSIS — I700 Atherosclerosis of aorta: Secondary | ICD-10-CM | POA: Diagnosis not present

## 2022-05-02 DIAGNOSIS — R002 Palpitations: Secondary | ICD-10-CM | POA: Diagnosis not present

## 2022-05-02 DIAGNOSIS — K589 Irritable bowel syndrome without diarrhea: Secondary | ICD-10-CM | POA: Diagnosis not present

## 2022-05-02 DIAGNOSIS — G309 Alzheimer's disease, unspecified: Secondary | ICD-10-CM | POA: Diagnosis not present

## 2022-05-02 DIAGNOSIS — Z0000 Encounter for general adult medical examination without abnormal findings: Secondary | ICD-10-CM | POA: Diagnosis not present

## 2022-05-02 DIAGNOSIS — F411 Generalized anxiety disorder: Secondary | ICD-10-CM | POA: Diagnosis not present

## 2022-05-02 DIAGNOSIS — F0280 Dementia in other diseases classified elsewhere without behavioral disturbance: Secondary | ICD-10-CM | POA: Diagnosis not present

## 2022-05-02 DIAGNOSIS — I10 Essential (primary) hypertension: Secondary | ICD-10-CM | POA: Diagnosis not present

## 2022-05-02 DIAGNOSIS — E7800 Pure hypercholesterolemia, unspecified: Secondary | ICD-10-CM | POA: Diagnosis not present

## 2022-05-02 DIAGNOSIS — R600 Localized edema: Secondary | ICD-10-CM | POA: Diagnosis not present

## 2022-05-02 DIAGNOSIS — E782 Mixed hyperlipidemia: Secondary | ICD-10-CM | POA: Diagnosis not present

## 2022-05-02 DIAGNOSIS — N1831 Chronic kidney disease, stage 3a: Secondary | ICD-10-CM | POA: Diagnosis not present

## 2022-05-08 DIAGNOSIS — N1831 Chronic kidney disease, stage 3a: Secondary | ICD-10-CM | POA: Diagnosis not present

## 2022-05-08 DIAGNOSIS — I10 Essential (primary) hypertension: Secondary | ICD-10-CM | POA: Diagnosis not present

## 2022-05-08 DIAGNOSIS — E782 Mixed hyperlipidemia: Secondary | ICD-10-CM | POA: Diagnosis not present

## 2022-05-08 DIAGNOSIS — Z131 Encounter for screening for diabetes mellitus: Secondary | ICD-10-CM | POA: Diagnosis not present

## 2022-05-08 DIAGNOSIS — Z0000 Encounter for general adult medical examination without abnormal findings: Secondary | ICD-10-CM | POA: Diagnosis not present

## 2022-07-16 DIAGNOSIS — R350 Frequency of micturition: Secondary | ICD-10-CM | POA: Diagnosis not present

## 2022-08-20 ENCOUNTER — Telehealth: Payer: Self-pay | Admitting: Physician Assistant

## 2022-08-20 NOTE — Telephone Encounter (Signed)
Son states his mother has dementia and is in assisted living and is trying to get some information changed on her social security but they dont accept POA. They are asking for a letter from her physician

## 2022-08-21 NOTE — Telephone Encounter (Signed)
POA is trying to get help with social security address change, mom has been moved to assisted living, and he needs to change over guardian ship to help with reasonable decisions, for him to take care of all afffairs, can you do this for him? Thank you, to call him back later

## 2022-08-22 NOTE — Telephone Encounter (Signed)
Left at the front office in an envelope at front office at 11:03am 08/22/2022

## 2022-09-11 ENCOUNTER — Telehealth: Payer: Self-pay | Admitting: Physician Assistant
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
## 2022-09-12 ENCOUNTER — Telehealth: Payer: Self-pay | Admitting: Physician Assistant

## 2022-09-12 MED ORDER — DIVALPROEX SODIUM 125 MG PO DR TAB: 125.0000 mg | DELAYED_RELEASE_TABLET | Freq: Every day | ORAL | 0 refills | Status: DC

## 2022-09-12 NOTE — Telephone Encounter (Signed)
Pls let him know that this class of medication (benzodiazepines) are not recommended in elderly patients with or without dementia. He can speak to his PCP but our office cannot Rx this for her, thanks

## 2022-09-12 NOTE — Telephone Encounter (Signed)
I called the [facility] had to leave message, will fax order as soon as I get the fax number, verbal orders left on voicemail but to call the office

## 2022-09-12 NOTE — Telephone Encounter (Signed)
Rexulti is FDA approved for agitation/aggression in Alzheimer's disease. Side effects may include drowsiness, dizziness. We have to start at low dose and increase gradually every week. If son can pick up starter pack for Rexulti 0.5mg  daily for 1 week, then 1mg  daily for 1 week, then 2mg  daily and continue. Thanks

## 2022-09-12 NOTE — Telephone Encounter (Signed)
Asked to send In 30 , sent rx. Thank you

## 2022-09-12 NOTE — Telephone Encounter (Signed)
Patient's son called, LVM about medication for Patient. The son would like to have the medication Ativan prescribed instead of Rexalti

## 2022-09-12 NOTE — Telephone Encounter (Signed)
If he wants to try low dose Depakote 125mg  at bedtime used for mood stabilization, side effects can be drowsiness, start at very low dose.

## 2022-09-12 NOTE — Telephone Encounter (Signed)
[facility] [phone]. Contact lady Porfirio Mylar [phone].

## 2022-09-12 NOTE — Telephone Encounter (Signed)
Verbalized understanding

## 2022-09-15 DIAGNOSIS — R399 Unspecified symptoms and signs involving the genitourinary system: Secondary | ICD-10-CM | POA: Diagnosis not present

## 2022-09-15 NOTE — Telephone Encounter (Signed)
Sent order to fax number given

## 2022-09-15 NOTE — Telephone Encounter (Signed)
Matt called back to give [place] the fax number to the [facility] so that we can get the order to them.   Fax number is [phone] Jose Persia

## 2022-09-17 ENCOUNTER — Telehealth: Payer: Self-pay | Admitting: Physician Assistant

## 2022-09-18 NOTE — Telephone Encounter (Signed)
Please fax over a hold order  on Depakote 125mg  to [facility] [phone] [id number]. Patient has been diagnosed with a UTI

## 2022-09-19 ENCOUNTER — Telehealth: Payer: Self-pay | Admitting: Physician Assistant

## 2022-10-09 ENCOUNTER — Other Ambulatory Visit: Payer: Self-pay | Admitting: Neurology

## 2022-11-09 ENCOUNTER — Other Ambulatory Visit: Payer: Self-pay | Admitting: Physician Assistant

## 2022-11-13 DIAGNOSIS — I700 Atherosclerosis of aorta: Secondary | ICD-10-CM | POA: Diagnosis not present

## 2022-11-13 DIAGNOSIS — R002 Palpitations: Secondary | ICD-10-CM | POA: Diagnosis not present

## 2022-11-13 DIAGNOSIS — N1831 Chronic kidney disease, stage 3a: Secondary | ICD-10-CM | POA: Diagnosis not present

## 2022-11-13 DIAGNOSIS — I10 Essential (primary) hypertension: Secondary | ICD-10-CM | POA: Diagnosis not present

## 2022-11-13 DIAGNOSIS — E782 Mixed hyperlipidemia: Secondary | ICD-10-CM | POA: Diagnosis not present

## 2022-11-13 DIAGNOSIS — Z23 Encounter for immunization: Secondary | ICD-10-CM | POA: Diagnosis not present

## 2022-11-13 DIAGNOSIS — G309 Alzheimer's disease, unspecified: Secondary | ICD-10-CM | POA: Diagnosis not present

## 2022-11-13 DIAGNOSIS — F411 Generalized anxiety disorder: Secondary | ICD-10-CM | POA: Diagnosis not present

## 2022-11-13 DIAGNOSIS — G4700 Insomnia, unspecified: Secondary | ICD-10-CM | POA: Diagnosis not present

## 2022-11-13 DIAGNOSIS — F0280 Dementia in other diseases classified elsewhere without behavioral disturbance: Secondary | ICD-10-CM | POA: Diagnosis not present

## 2022-11-13 DIAGNOSIS — H6122 Impacted cerumen, left ear: Secondary | ICD-10-CM | POA: Diagnosis not present

## 2022-12-10 DIAGNOSIS — R58 Hemorrhage, not elsewhere classified: Secondary | ICD-10-CM | POA: Diagnosis not present

## 2023-01-10 DIAGNOSIS — Z1231 Encounter for screening mammogram for malignant neoplasm of breast: Secondary | ICD-10-CM | POA: Diagnosis not present

## 2023-01-18 ENCOUNTER — Emergency Department (HOSPITAL_COMMUNITY)
Admission: EM | Admit: 2023-01-18 | Discharge: 2023-01-19 | Disposition: A | Discharge status: Home / Self Care | Payer: PPO | Attending: Emergency Medicine | Admitting: Emergency Medicine

## 2023-01-18 ENCOUNTER — Other Ambulatory Visit: Payer: Self-pay

## 2023-01-18 ENCOUNTER — Emergency Department (HOSPITAL_COMMUNITY): Payer: PPO

## 2023-01-18 ENCOUNTER — Encounter (HOSPITAL_COMMUNITY): Payer: Self-pay | Admitting: Emergency Medicine

## 2023-01-18 DIAGNOSIS — N189 Chronic kidney disease, unspecified: Secondary | ICD-10-CM | POA: Insufficient documentation

## 2023-01-18 DIAGNOSIS — R55 Syncope and collapse: Secondary | ICD-10-CM | POA: Diagnosis not present

## 2023-01-18 DIAGNOSIS — T50905A Adverse effect of unspecified drugs, medicaments and biological substances, initial encounter: Secondary | ICD-10-CM | POA: Insufficient documentation

## 2023-01-18 DIAGNOSIS — I129 Hypertensive chronic kidney disease with stage 1 through stage 4 chronic kidney disease, or unspecified chronic kidney disease: Secondary | ICD-10-CM | POA: Diagnosis not present

## 2023-01-18 DIAGNOSIS — W19XXXA Unspecified fall, initial encounter: Secondary | ICD-10-CM | POA: Diagnosis not present

## 2023-01-18 DIAGNOSIS — R0989 Other specified symptoms and signs involving the circulatory and respiratory systems: Secondary | ICD-10-CM | POA: Diagnosis not present

## 2023-01-18 DIAGNOSIS — R918 Other nonspecific abnormal finding of lung field: Secondary | ICD-10-CM | POA: Diagnosis not present

## 2023-01-18 LAB — CBC WITH DIFFERENTIAL/PLATELET
Abs Immature Granulocytes: 0.04 10*3/uL (ref 0.00–0.07)
Basophils Absolute: 0.1 10*3/uL (ref 0.0–0.1)
Basophils Relative: 1 %
Eosinophils Absolute: 0.3 10*3/uL (ref 0.0–0.5)
Eosinophils Relative: 3 %
HCT: 34.4 % — ABNORMAL LOW (ref 36.0–46.0)
Hemoglobin: 10.6 g/dL — ABNORMAL LOW (ref 12.0–15.0)
Immature Granulocytes: 1 %
Lymphocytes Relative: 18 %
Lymphs Abs: 1.5 10*3/uL (ref 0.7–4.0)
MCH: 29.4 pg (ref 26.0–34.0)
MCHC: 30.8 g/dL (ref 30.0–36.0)
MCV: 95.3 fL (ref 80.0–100.0)
Monocytes Absolute: 0.8 10*3/uL (ref 0.1–1.0)
Monocytes Relative: 9 %
Neutro Abs: 5.8 10*3/uL (ref 1.7–7.7)
Neutrophils Relative %: 68 %
Platelets: 300 10*3/uL (ref 150–400)
RBC: 3.61 MIL/uL — ABNORMAL LOW (ref 3.87–5.11)
RDW: 12.7 % (ref 11.5–15.5)
WBC: 8.5 10*3/uL (ref 4.0–10.5)
nRBC: 0 % (ref 0.0–0.2)

## 2023-01-18 LAB — BASIC METABOLIC PANEL
Anion gap: 6 (ref 5–15)
BUN: 26 mg/dL — ABNORMAL HIGH (ref 8–23)
CO2: 18 mmol/L — ABNORMAL LOW (ref 22–32)
Calcium: 8.3 mg/dL — ABNORMAL LOW (ref 8.9–10.3)
Chloride: 114 mmol/L — ABNORMAL HIGH (ref 98–111)
Creatinine, Ser: 1.08 mg/dL — ABNORMAL HIGH (ref 0.44–1.00)
GFR, Estimated: 52 mL/min — ABNORMAL LOW (ref >60)
Glucose, Bld: 131 mg/dL — ABNORMAL HIGH (ref 70–99)
Potassium: 4.2 mmol/L (ref 3.5–5.1)
Sodium: 138 mmol/L (ref 135–145)

## 2023-01-18 LAB — TROPONIN I (HIGH SENSITIVITY): Troponin I (High Sensitivity): 5 ng/L (ref <18)

## 2023-01-18 LAB — CBG MONITORING, ED: Glucose-Capillary: 129 mg/dL — ABNORMAL HIGH (ref 70–99)

## 2023-01-18 LAB — MAGNESIUM: Magnesium: 1.9 mg/dL (ref 1.7–2.4)

## 2023-01-18 MED ORDER — SODIUM CHLORIDE 0.9 % IV BOLUS
1000.0000 mL | Freq: Once | INTRAVENOUS | Status: AC
  Administered 2023-01-18: 1000 mL via INTRAVENOUS

## 2023-01-18 NOTE — ED Provider Notes (Signed)
[place] EMERGENCY DEPARTMENT AT [facility] Provider Note   CSN: [id number] Arrival date & time: 01/18/23  2154     History {Add pertinent medical, surgical, social history, OB history to HPI:1} Chief Complaint  Patient presents with   Near Syncope    VALARIA DELSIGNORE is a 82 y.o. female.  82 year old female with history of Alzheimer's (nonverbal) CKD, HTN, HLD, and anxiety who presents to the emergency department after syncopal event.  History obtained per the patient's son.  Reports that she has been in good health recently.  She was at her facility [facility]) and was brushing her teeth.  During this she had an episode where she lost consciousness.  Her son caught her and she did not fall.  Just prior to that she had clonazepam and trazodone.  No recent illnesses and has been taking in adequate p.o.  No similar events in the past.  Does not appear to be in pain at all.  Per EMS was hypotensive and received 500 cc bolus of fluids.       Home Medications Prior to Admission medications   Medication Sig Start Date End Date Taking? Authorizing Provider  calcium carbonate (OSCAL) 1500 (600 Ca) MG TABS tablet Take 2 tablets by mouth 2 (two) times daily with a meal.    [provider]  citalopram (CELEXA) 10 MG tablet Take 10 mg by mouth daily. 10/07/20   [provider]  clonazePAM (KLONOPIN) 0.5 MG tablet Take 0.5 mg by mouth daily as needed for anxiety (anxiety).    [provider]  dicyclomine (BENTYL) 10 MG capsule Take 1 capsule (10 mg total) by mouth 2 times daily at 12 noon and 4 pm. 03/17/16   Sherrilyn Rist, MD  divalproex (DEPAKOTE) 125 MG DR tablet TAKE 1 TABLET BY MOUTH DAILY. 10/10/22   Marcos Eke, PA-C  donepezil (ARICEPT) 10 MG tablet Take  1 tablet at 10 mg daily 10/31/21   Marcos Eke, PA-C  metoprolol succinate (TOPROL-XL) 50 MG 24 hr tablet Take 50 mg by mouth daily. 08/11/20   [provider]  nitrofurantoin  (MACRODANTIN) 50 MG capsule Take 50 mg by mouth See admin instructions. Every 3 to 4 days.    [provider]  nitrofurantoin, macrocrystal-monohydrate, (MACROBID) 100 MG capsule  03/05/16   [provider]  Omega-3 Fatty Acids (FISH OIL PO) Take 1 tablet by mouth as needed. Occasionally - when she remembers    [provider]  ondansetron (ZOFRAN) 8 MG tablet Take 1 tablet (8 mg total) by mouth every 8 (eight) hours as needed for nausea or vomiting. 11/25/13   Hart Carwin, MD  pantoprazole (PROTONIX) 40 MG tablet Take 1 tablet (40 mg total) by mouth daily. 08/06/16   Napoleon Form, MD      Allergies    Amoxicillin, Amoxicillin-pot clavulanate, Cefuroxime, Influenza vac split quad, Levofloxacin, Paroxetine hcl, Pneumococcal 13-val conj vacc, Quinolones, Sulfa antibiotics, and Topiramate    Review of Systems   Review of Systems  Physical Exam Updated Vital Signs BP (!) 98/36   Pulse (!) 55   Temp 97.6 F (36.4 C) (Oral)   Resp 15   Ht 5\' 5"  (1.651 m)   Wt 61.7 kg   SpO2 100%   BMI 22.64 kg/m  Physical Exam Vitals and nursing note reviewed.  Constitutional:      General: She is not in acute distress.    Appearance: She is well-developed.  Comments: Attempts to communicate but can only say single word phrases that do not always make sense  HENT:     Head: Normocephalic and atraumatic.     Right Ear: External ear normal.     Left Ear: External ear normal.     Nose: Nose normal.  Eyes:     Extraocular Movements: Extraocular movements intact.     Conjunctiva/sclera: Conjunctivae normal.     Pupils: Pupils are equal, round, and reactive to light.  Cardiovascular:     Rate and Rhythm: Normal rate and regular rhythm.     Heart sounds: No murmur heard. Pulmonary:     Effort: Pulmonary effort is normal. No respiratory distress.     Breath sounds: Normal breath sounds.  Musculoskeletal:     Cervical back: Normal range of motion and neck supple.      Right lower leg: No edema.     Left lower leg: No edema.  Skin:    General: Skin is warm and dry.  Neurological:     Mental Status: She is alert. Mental status is at baseline.     Comments: Cranial nerves grossly intact.  Moving all 4 extremities  Psychiatric:        Mood and Affect: Mood normal.     ED Results / Procedures / Treatments   Labs (all labs ordered are listed, but only abnormal results are displayed) Labs Reviewed  BASIC METABOLIC PANEL - Abnormal; Notable for the following components:      Result Value   Chloride 114 (*)    CO2 18 (*)    Glucose, Bld 131 (*)    BUN 26 (*)    Creatinine, Ser 1.08 (*)    Calcium 8.3 (*)    GFR, Estimated 52 (*)    All other components within normal limits  CBC WITH DIFFERENTIAL/PLATELET - Abnormal; Notable for the following components:   RBC 3.61 (*)    Hemoglobin 10.6 (*)    HCT 34.4 (*)    All other components within normal limits  CBG MONITORING, ED - Abnormal; Notable for the following components:   Glucose-Capillary 129 (*)    All other components within normal limits  MAGNESIUM  TROPONIN I (HIGH SENSITIVITY)    EKG None  Radiology DG Chest Portable 1 View Result Date: 01/18/2023 CLINICAL DATA:  Syncope EXAM: PORTABLE CHEST 1 VIEW COMPARISON:  02/01/2013 FINDINGS: Low lung volumes. Left basilar atelectasis or infiltrates. No pleural effusion or pneumothorax. Stable cardiomediastinal silhouette. No displaced rib fractures. IMPRESSION: Hypoinflation and left basilar atelectasis or infiltrates. Electronically Signed   By: Minerva Fester M.D.   On: 01/18/2023 22:14    Procedures Procedures  {Document cardiac monitor, telemetry assessment procedure when appropriate:1}  Medications Ordered in ED Medications  sodium chloride 0.9 % bolus 1,000 mL (has no administration in time range)    ED Course/ Medical Decision Making/ A&P   {   Click here for ABCD2, HEART and other calculatorsREFRESH Note before signing  :1}                              Medical Decision Making Amount and/or Complexity of Data Reviewed Labs: ordered. Radiology: ordered.   ***  {Document critical care time when appropriate:1} {Document review of labs and clinical decision tools ie heart score, Chads2Vasc2 etc:1}  {Document your independent review of radiology images, and any outside records:1} {Document your discussion with family members, caretakers, and with  consultants:1} {Document social determinants of health affecting pt's care:1} {Document your decision making why or why not admission, treatments were needed:1} Final Clinical Impression(s) / ED Diagnoses Final diagnoses:  None    Rx / DC Orders ED Discharge Orders     None

## 2023-01-18 NOTE — ED Triage Notes (Signed)
Pt BIB EMS from [facility] pt was brushing teeth and had syncopal episode. Pt son was behind pt to catch her. No injury. On arrival from EMS pt was hypotensive. Pt has hx of dementia and does not speak.   80/32 on arrival  after fluid 140/78 20LAC  500cc NS CBG 139

## 2023-01-19 LAB — TROPONIN I (HIGH SENSITIVITY): Troponin I (High Sensitivity): 7 ng/L (ref <18)

## 2023-01-19 MED ORDER — METOPROLOL SUCCINATE ER 50 MG PO TB24: 25.0000 mg | ORAL_TABLET | Freq: Every day | ORAL | 2 refills | Status: AC

## 2023-01-19 MED ORDER — TRAZODONE HCL 50 MG PO TABS: 25.0000 mg | ORAL_TABLET | Freq: Every day | ORAL | 2 refills | Status: DC

## 2023-01-19 NOTE — Discharge Instructions (Signed)
You were seen for passing out in the emergency department.   At home, please stay well-hydrated.    Check your MyChart online for the results of any tests that had not resulted by the time you left the emergency department.   Follow-up with your primary doctor in 2-3 days regarding your visit.    Return immediately to the emergency department if you experience any of the following: Chest pain, recurrent fainting, or any other concerning symptoms.    Thank you for visiting our Emergency Department. It was a pleasure taking care of you today.

## 2023-01-23 DIAGNOSIS — F0280 Dementia in other diseases classified elsewhere without behavioral disturbance: Secondary | ICD-10-CM | POA: Diagnosis not present

## 2023-01-23 DIAGNOSIS — R55 Syncope and collapse: Secondary | ICD-10-CM | POA: Diagnosis not present

## 2023-01-23 DIAGNOSIS — I10 Essential (primary) hypertension: Secondary | ICD-10-CM | POA: Diagnosis not present

## 2023-01-23 DIAGNOSIS — N179 Acute kidney failure, unspecified: Secondary | ICD-10-CM | POA: Diagnosis not present

## 2023-01-23 DIAGNOSIS — R928 Other abnormal and inconclusive findings on diagnostic imaging of breast: Secondary | ICD-10-CM | POA: Diagnosis not present

## 2023-01-23 DIAGNOSIS — D649 Anemia, unspecified: Secondary | ICD-10-CM | POA: Diagnosis not present

## 2023-01-23 DIAGNOSIS — E860 Dehydration: Secondary | ICD-10-CM | POA: Diagnosis not present

## 2023-01-26 DIAGNOSIS — N6325 Unspecified lump in the left breast, overlapping quadrants: Secondary | ICD-10-CM | POA: Diagnosis not present

## 2023-01-26 DIAGNOSIS — R922 Inconclusive mammogram: Secondary | ICD-10-CM | POA: Diagnosis not present

## 2023-02-22 NOTE — Progress Notes (Signed)
Assessment/Plan:   Dementia likely due to Alzheimer's disease with behavioral disturbance  [name] is delightful 83 y.o. RH female with a history of  irritable bowel syndrome, fibromyalgia, status post cholecystectomy and neobladder creation, followed by urology,  anxiety, depression dementia likely due to Alzheimer's disease per Neuropsych evaluation 03/2021 seen today in follow up. Cognitive decline is noted. Patient is not on antidementia medications at as the risks of it outweigh the benefits and these may no longer be therapeutic. She has been showing  ambulation decline as well, likely due to the disease. Discussed with her son the nature of the disease and some other organic issues to look for, including  fall prevention, the need for walker for safety and stability, the role of PT for strength and balance among other issues that will be arising as the disease progresses. Her behavior is well managed by PCP. All questions were answered to her son to his satisfaction. He was very Adult nurse.     Recommend good control of her cardiovascular risk factors Continue to control mood as per PCP Continue 24/7 care as Memory Care facility. Agree with using the walker for stability to prevent falls Recommend PT for strength and balance at the facility    Subjective:    This patient is accompanied in the office by her son who supplements the history.  Previous records as well as any outside records available were reviewed prior to todays visit. Patient was last seen on 12/05/21. Last MMSE in 10/2020 was 12/30       Any changes in memory since last visit? " Worse". She is essentially non conversant. She is unable to recognize her loved ones except for her son. May understand some commands. She able to say simple words. HHN interacts with her daily and takes her out for social stimulation.  repeats oneself?  Endorsed.  Disoriented when walking into a room?  Patient denies    Leaving  objects?  May misplace things but not in unusual places   Wandering behavior?  denies   Any personality changes since last visit? "Some days she may be more irritable, feisty". She may attempt to hit if she does not like or want something.  Any worsening depression?:  Denies.   Hallucinations or paranoia?  Denies.   Seizures? denies    Any sleep changes?  Does not sleep well, takes clonazepam.   Sleep apnea?   Denies.   Any hygiene concerns? She needs to be guided to brush teeth Independent of bathing and dressing?  Needs assistance to dress and bathe.  Does the patient needs help with medications?  Facility  is in charge   Who is in charge of the finances?  Son is in charge     Any changes in appetite? Some days she eats more than others. He son brings her good food.     Patient have trouble swallowing? Denies.   Does the patient cook? No Any headaches?   denies   Chronic back pain  denies.   Ambulates with difficulty? "There is a  difference in walking, leans to the R side".  Her son provided a video of her ambulating, and it shows that she does lean to the right, her steps are broader as well.  She was recently prescribed by her primary physician a walker, which she has not yet started to use. Recent falls or head injuries? denies      Unilateral weakness, numbness or tingling? denies  Any tremors?  Denies   Any anosmia?  Denies   Any incontinence of urine?  Endorsed, wears diapers Any bowel dysfunction?   Denies      Patient lives with [facility]  Does the patient drive? No longer drives     Neuropsych evaluation Dr. Milbert Coulter 04/2021 Briefly, results suggested diffuse and quite severe impairment. Unfortunately, there were no areas which suggested appropriate performance when compared to age-matched peers. Impairments were seen across all assessed cognitive domains. Given diffuse, severe impairment with no discernible pattern of strength and weakness, the etiology of  ongoing impairment is difficult to confidently state. Alzheimer's disease remains a prime candidate. [name] did not benefit from repeated learning trials, was essentially amnestic across all memory tasks after a short delay, and performed poorly across yes/no recognition trials. This suggests the presence of rapid forgetting and a severe memory storage deficit, both of which are the hallmark memory characteristics of this illness. As Alzheimer's disease progresses, eventually it will affect all cognitive domains and current testing could suggest that [name] is in moderate stages at the present time. Given cognitive impairment and outward language dysfunction, I cannot rule out a primary progressive aphasia (PPA) presentation. However, Ms. Gripp's age is advanced compared to when PPA presentations typically present and recent imaging did not suggest advanced frontal or temporal lobe atrophy. Additionally, her son's reported timeline of memory decline early-on followed by broad progressive decline is more consistent with Alzheimer's disease, making a PPA presentation less likely. She and her son did not report behavioral characteristics of Lewy body dementia, the behavioral variant of frontotemporal dementia, or a more rare parkinsonian condition. Recent neuroimaging also did not suggest significant cerebrovascular changes or prior stroke, making a vascular dementia presentation unlikely. As such, dementia due to Alzheimer's disease remains as the most likely culprit at the present time.       Initial visit 10/13//22 The patient is seen in neurologic consultation at the request of Clovis Riley, L.August Saucer, MD for the evaluation of memory.  The patient is here alone, and when asked if there is anyone that we could talk to on the phone, she said "no".  History is very limited.  This is a 83 year old woman, who denies having any memory issues.  Apparently, she was seen at the office of her primary care physician 1  year ago,at which time she had an MMSE, as her son was reporting that the patient was having difficulties recalling.  This yielded a score of 28/30.  During the last visit to her PCP, her son continued to report worsening of her memory, which she denied.  This scored 24/30, equals an MoCA of 18.  She states that her memory is "very good ". Her son states that she had difficulty remembering even "major life events that she should remember ".  In the past, she was taking Prevagen, without any improvement.   She denies repeating the same stories or asking the same questions.  When asked about her mood she reports "fine".  Per chart report, her son states that her husband died about 3 years ago, with associated depression, she is on medications for that.   Of note, the patient does not elaborate, only answers to questions yes or no.  She likes crossword puzzles, and word fining.  She watches TV often.  When asked if she sleeps well, she agrees.  She denies any vivid dreams or sleepwalking, hallucinations or paranoia.  "Sometimes ", she leaves objects but  not in unusual places.  She is independent of bathing and dressing.  When I ask about missing medications, she denies it, although she states that takes it "every other day ".-Some of the medications are prescribed daily, and some of them every 8 hours as needed. It is unclear who is in charge of the finances, when asked that question, the patient reports "not support from what ever ".  Appetite is good, denies trouble swallowing.  She cooks "sometimes ".  Denies leaving the stove or the faucet on.  She ambulates daily without difficulty, without the use of a walker or a cane.  She denies any falls or head injuries.  She reports that she drives, and denies getting lost.  Denies headaches, double vision, dizziness, focal numbness or tingling, unilateral weakness or tremors.  She has a history of urinary issues in view of neobladder, followed by urology, with frequent  UTIs, last in August of this year.  Denies constipation or diarrhea, or anosmia.  Denies a history of sleep apnea, alcohol or tobacco.  No family history of dementia.   PREVIOUS MEDICATIONS:   CURRENT MEDICATIONS:  Outpatient Encounter Medications as of 02/23/2023  Medication Sig   calcium carbonate (OSCAL) 1500 (600 Ca) MG TABS tablet Take 2 tablets by mouth 2 (two) times daily with a meal.   citalopram (CELEXA) 10 MG tablet Take 10 mg by mouth daily.   clonazePAM (KLONOPIN) 0.5 MG tablet Take 0.5 mg by mouth daily as needed for anxiety (anxiety).   dicyclomine (BENTYL) 10 MG capsule Take 1 capsule (10 mg total) by mouth 2 times daily at 12 noon and 4 pm.   divalproex (DEPAKOTE) 125 MG DR tablet TAKE 1 TABLET BY MOUTH DAILY.   donepezil (ARICEPT) 10 MG tablet Take  1 tablet at 10 mg daily   metoprolol succinate (TOPROL-XL) 50 MG 24 hr tablet Take 0.5 tablets (25 mg total) by mouth daily.   nitrofurantoin, macrocrystal-monohydrate, (MACROBID) 100 MG capsule    Omega-3 Fatty Acids (FISH OIL PO) Take 1 tablet by mouth as needed. Occasionally - when she remembers   ondansetron (ZOFRAN) 8 MG tablet Take 1 tablet (8 mg total) by mouth every 8 (eight) hours as needed for nausea or vomiting.   pantoprazole (PROTONIX) 40 MG tablet Take 1 tablet (40 mg total) by mouth daily.   traZODone (DESYREL) 50 MG tablet Take 0.5 tablets (25 mg total) by mouth at bedtime.   nitrofurantoin (MACRODANTIN) 50 MG capsule Take 50 mg by mouth See admin instructions. Every 3 to 4 days. (Patient not taking: Reported on 02/23/2023)   Facility-Administered Encounter Medications as of 02/23/2023  Medication   0.9 %  sodium chloride infusion        No data to display            11/08/2020   12:00 PM  Montreal Cognitive Assessment   Visuospatial/ Executive (0/5) 0  Naming (0/3) 3  Attention: Read list of digits (0/2) 2  Attention: Read list of letters (0/1) 1  Attention: Serial 7 subtraction starting at 100 (0/3)  0  Language: Repeat phrase (0/2) 0  Language : Fluency (0/1) 0  Abstraction (0/2) 1  Delayed Recall (0/5) 0  Orientation (0/6) 5  Total 12  Adjusted Score (based on education) 12    Objective:     PHYSICAL EXAMINATION:    VITALS:   Vitals:   02/23/23 0907  BP: 110/60  Resp: 20  Weight: 142 lb (64.4 kg)  GEN:  The patient appears stated age and is in NAD. HEENT:  Normocephalic, atraumatic.   Neurological examination:  General: NAD, well-groomed, appears stated age. Orientation: The patient is alert. Not oriented to person, place and date Cranial nerves: There is good facial symmetry.The speech is non fluent or clear.  No aphasia or dysarthria. Fund of knowledge is reduced. Recent and remote memory are impaired. Attention and concentration are reduced.  Unable to name objects and repeat phrases.  Hearing is intact to conversational tone. Sensation: Sensation is intact to light touch throughout Motor: Strength is at least antigravity x4. DTR's 2/4 in UE/LE     Movement examination: Tone: There is normal tone in the UE/LE Abnormal movements:  no tremor.  No myoclonus.  No asterixis.   Coordination:  There is decremation with RAM's.  She is unable to perform FTN Gait and Station  The patient has no  difficulty arising out of a deep-seated chair without the use of the hands. The patient's stride length is good.  Gait is cautious and mildly broad-based, she does lean to the right. Thank you for allowing [place] the opportunity to participate in the care of this nice patient. Please do not hesitate to contact us for any questions or concerns.   Total time spent on today's visit was 33 minutes dedicated to this patient today, preparing to see patient, examining the patient, ordering tests and/or medications and counseling the patient, documenting clinical information in the EHR or other health record, independently interpreting results and communicating results to the patient/family,  discussing treatment and goals, answering patient's questions and coordinating care.  Cc:  Ollen Bowl, MD  Marlowe Kays 02/23/2023 12:22 PM

## 2023-02-23 ENCOUNTER — Encounter: Payer: Self-pay | Admitting: Physician Assistant

## 2023-02-23 ENCOUNTER — Ambulatory Visit: Payer: PPO | Admitting: Physician Assistant

## 2023-02-23 VITALS — BP 110/60 | Resp 20 | Wt 142.0 lb

## 2023-02-23 DIAGNOSIS — G309 Alzheimer's disease, unspecified: Secondary | ICD-10-CM | POA: Diagnosis not present

## 2023-02-23 DIAGNOSIS — F0280 Dementia in other diseases classified elsewhere without behavioral disturbance: Secondary | ICD-10-CM

## 2023-02-23 NOTE — Patient Instructions (Signed)
It was a pleasure to see you today at our office.   Recommendations:  Recommend PT and walker   Whom to call:  Memory  decline, memory medications: Call our office [phone]   For psychiatric meds, mood meds: Please have your primary care physician manage these medications.    For assessment of decision of mental capacity and competency:  Call Dr. Erick Blinks, geriatric psychiatrist at [phone]  For guidance in geriatric dementia issues please call [facility] [phone]  For guidance regarding [facility] and if placement were needed at the facility, contact Sidney Ace, Social Worker tel: [phone]  If you have any severe symptoms of a stroke, or other severe issues such as confusion,severe chills or fever, etc call 911 or go to the ER as you may need to be evaluated further        RECOMMENDATIONS FOR ALL PATIENTS WITH MEMORY PROBLEMS: 1. Continue to exercise (Recommend 30 minutes of walking everyday, or 3 hours every week) 2. Increase social interactions - continue going to [place] and enjoy social gatherings with friends and family 3. Eat healthy, avoid fried foods and eat more fruits and vegetables 4. Maintain adequate blood pressure, blood sugar, and blood cholesterol level. Reducing the risk of stroke and cardiovascular disease also helps promoting better memory. 5. Avoid stressful situations. Live a simple life and avoid aggravations. Organize your time and prepare for the next day in anticipation. 6. Sleep well, avoid any interruptions of sleep and avoid any distractions in the bedroom that may interfere with adequate sleep quality 7. Avoid sugar, avoid sweets as there is a strong link between excessive sugar intake, diabetes, and cognitive impairment We discussed the Mediterranean diet, which has been shown to help patients reduce the risk of progressive memory disorders and reduces cardiovascular risk. This includes eating  fish, eat fruits and green leafy vegetables, nuts like almonds and hazelnuts, walnuts, and also use olive oil. Avoid fast foods and fried foods as much as possible. Avoid sweets and sugar as sugar use has been linked to worsening of memory function.  There is always a concern of gradual progression of memory problems. If this is the case, then we may need to adjust level of care according to patient needs. Support, both to the patient and caregiver, should then be put into place.    The Alzheimer[employer] Association is here all day, every day for people facing Alzheimer's disease through our free 24/7 Helpline: ([phone]. The Helpline provides reliable information and support to all those who need assistance, such as individuals living with memory loss, Alzheimer's or other dementia, caregivers, health care professionals and the public.  Our highly trained and knowledgeable staff can help you with: Understanding memory loss, dementia and Alzheimer's  Medications and other treatment options  General information about aging and brain health  Skills to provide quality care and to find the best care from professionals  Legal, financial and living-arrangement decisions Our Helpline also features: Confidential care consultation provided by master's level clinicians who can help with decision-making support, crisis assistance and education on issues families face every day  Help in a caller's preferred language using our translation service that features more than 200 languages and dialects  Referrals to local community programs, services and ongoing support     FALL PRECAUTIONS: Be cautious when walking. Scan the area for obstacles that may increase the risk of trips and falls. When getting up in the mornings, sit up at the edge of  the bed for a few minutes before getting out of bed. Consider elevating the bed at the head end to avoid drop of blood pressure when getting up. Walk always in a well-lit room  (use night lights in the walls). Avoid area rugs or power cords from appliances in the middle of the walkways. Use a walker or a cane if necessary and consider physical therapy for balance exercise. Get your eyesight checked regularly.  FINANCIAL OVERSIGHT: Supervision, especially oversight when making financial decisions or transactions is also recommended.  HOME SAFETY: Consider the safety of the kitchen when operating appliances like stoves, microwave oven, and blender. Consider having supervision and share cooking responsibilities until no longer able to participate in those. Accidents with firearms and other hazards in the house should be identified and addressed as well.   ABILITY TO BE LEFT ALONE: If patient is unable to contact 911 operator, consider using LifeLine, or when the need is there, arrange for someone to stay with patients. Smoking is a fire hazard, consider supervision or cessation. Risk of wandering should be assessed by caregiver and if detected at any point, supervision and safe proof recommendations should be instituted.  MEDICATION SUPERVISION: Inability to self-administer medication needs to be constantly addressed. Implement a mechanism to ensure safe administration of the medications.   DRIVING: Regarding driving, in patients with progressive memory problems, driving will be impaired. We advise to have someone else do the driving if trouble finding directions or if minor accidents are reported. Independent driving assessment is available to determine safety of driving.   If you are interested in the driving assessment, you can contact the following:  [employer] in [place] [phone]  [facility] [phone]  [facility] [phone] or [phone]      Mediterranean Diet A Mediterranean diet refers to food and lifestyle choices that are based on the traditions of countries located  on the [employer] [place]. This way of eating has been shown to help prevent certain conditions and improve outcomes for people who have chronic diseases, like kidney disease and heart disease. What are tips for following this plan? Lifestyle  Cook and eat meals together with your family, when possible. Drink enough fluid to keep your urine clear or pale yellow. Be physically active every day. This includes: Aerobic exercise like running or swimming. Leisure activities like gardening, walking, or housework. Get 7-8 hours of sleep each night. If recommended by your health care provider, drink red wine in moderation. This means 1 glass a day for nonpregnant women and 2 glasses a day for men. A glass of wine equals 5 oz (150 mL). Reading food labels  Check the serving size of packaged foods. For foods such as rice and pasta, the serving size refers to the amount of cooked product, not dry. Check the total fat in packaged foods. Avoid foods that have saturated fat or trans fats. Check the ingredients list for added sugars, such as corn syrup. Shopping  At the grocery store, buy most of your food from the areas near the walls of the store. This includes: Fresh fruits and vegetables (produce). Grains, beans, nuts, and seeds. Some of these may be available in unpackaged forms or large amounts (in bulk). Fresh seafood. Poultry and eggs. Low-fat dairy products. Buy whole ingredients instead of prepackaged foods. Buy fresh fruits and vegetables in-season from local farmers markets. Buy frozen fruits and vegetables in resealable bags. If you do not have access to  quality fresh seafood, buy precooked frozen shrimp or canned fish, such as tuna, salmon, or sardines. Buy small amounts of raw or cooked vegetables, salads, or olives from the deli or salad bar at your store. Stock your pantry so you always have certain foods on hand, such as olive oil, canned tuna, canned tomatoes, rice, pasta, and  beans. Cooking  Cook foods with extra-virgin olive oil instead of using butter or other vegetable oils. Have meat as a side dish, and have vegetables or grains as your main dish. This means having meat in small portions or adding small amounts of meat to foods like pasta or stew. Use beans or vegetables instead of meat in common dishes like chili or lasagna. Experiment with different cooking methods. Try roasting or broiling vegetables instead of steaming or sauteing them. Add frozen vegetables to soups, stews, pasta, or rice. Add nuts or seeds for added healthy fat at each meal. You can add these to yogurt, salads, or vegetable dishes. Marinate fish or vegetables using olive oil, lemon juice, garlic, and fresh herbs. Meal planning  Plan to eat 1 vegetarian meal one day each week. Try to work up to 2 vegetarian meals, if possible. Eat seafood 2 or more times a week. Have healthy snacks readily available, such as: Vegetable sticks with hummus. Greek yogurt. Fruit and nut trail mix. Eat balanced meals throughout the week. This includes: Fruit: 2-3 servings a day Vegetables: 4-5 servings a day Low-fat dairy: 2 servings a day Fish, poultry, or lean meat: 1 serving a day Beans and legumes: 2 or more servings a week Nuts and seeds: 1-2 servings a day Whole grains: 6-8 servings a day Extra-virgin olive oil: 3-4 servings a day Limit red meat and sweets to only a few servings a month What are my food choices? Mediterranean diet Recommended Grains: Whole-grain pasta. Brown rice. Bulgar wheat. Polenta. Couscous. Whole-wheat bread. [place]. Vegetables: Artichokes. Beets. Broccoli. Cabbage. Carrots. Eggplant. Green beans. Chard. Kale. Spinach. Onions. Leeks. Peas. Squash. Tomatoes. Peppers. Radishes. Fruits: Apples. Apricots. Avocado. Berries. Bananas. Cherries. Dates. Figs. Grapes. Lemons. Melon. Oranges. Peaches. Plums. Pomegranate. Meats and other protein foods: Beans. Almonds.  Sunflower seeds. Pine nuts. Peanuts. Cod. Salmon. Scallops. Shrimp. Tuna. Tilapia. Clams. Oysters. Eggs. Dairy: Low-fat milk. Cheese. Greek yogurt. Beverages: Water. Red wine. Herbal tea. Fats and oils: Extra virgin olive oil. Avocado oil. Grape seed oil. Sweets and desserts: [place] yogurt with honey. Baked apples. Poached pears. Trail mix. Seasoning and other foods: Basil. Cilantro. Coriander. Cumin. Mint. Parsley. Sage. Rosemary. Tarragon. Garlic. Oregano. Thyme. Pepper. Balsalmic vinegar. Tahini. Hummus. Tomato sauce. Olives. Mushrooms. Limit these Grains: Prepackaged pasta or rice dishes. Prepackaged cereal with added sugar. Vegetables: Deep fried potatoes (french fries). Fruits: Fruit canned in syrup. Meats and other protein foods: Beef. Pork. Lamb. Poultry with skin. Hot dogs. Tomasa Blase. Dairy: Ice cream. Sour cream. Whole milk. Beverages: Juice. Sugar-sweetened soft drinks. Beer. Liquor and spirits. Fats and oils: Butter. Canola oil. Vegetable oil. Beef fat (tallow). Lard. Sweets and desserts: Cookies. Cakes. Pies. Candy. Seasoning and other foods: Mayonnaise. Premade sauces and marinades. The items listed may not be a complete list. Talk with your dietitian about what dietary choices are right for you. Summary The Mediterranean diet includes both food and lifestyle choices. Eat a variety of fresh fruits and vegetables, beans, nuts, seeds, and whole grains. Limit the amount of red meat and sweets that you eat. Talk with your health care provider about whether it is safe for you to drink red  wine in moderation. This means 1 glass a day for nonpregnant women and 2 glasses a day for men. A glass of wine equals 5 oz (150 mL). This information is not intended to replace advice given to you by your health care provider. Make sure you discuss any questions you have with your health care provider. Document Released: 09/06/2015 Document Revised: 10/09/2015 Document Reviewed: 09/06/2015 Elsevier  Interactive Patient Education  2017 [employer].

## 2023-03-16 ENCOUNTER — Ambulatory Visit: Payer: PPO | Admitting: Podiatry

## 2023-03-16 ENCOUNTER — Encounter: Payer: Self-pay | Admitting: Podiatry

## 2023-03-16 DIAGNOSIS — B351 Tinea unguium: Secondary | ICD-10-CM | POA: Diagnosis not present

## 2023-03-16 DIAGNOSIS — M79675 Pain in left toe(s): Secondary | ICD-10-CM | POA: Diagnosis not present

## 2023-03-16 DIAGNOSIS — M79674 Pain in right toe(s): Secondary | ICD-10-CM | POA: Diagnosis not present

## 2023-03-16 NOTE — Progress Notes (Signed)
   Chief Complaint  Patient presents with   Nail Problem    Patient son states he needs someone to look at her feet, his mothers toe nails do not look to good, patient son believes she may have toe nail fungus.    SUBJECTIVE Patient PMHx end-stage Alzheimer's presenting with her son today presents to office today complaining of elongated, thickened nails that cause pain while ambulating in shoes.  Patient is unable to trim their own nails. Patient is here for further evaluation and treatment.  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   History of urinary diversion procedure 07/21/2019   Hypercholesterolemia    Insomnia    Interstitial cystitis 09/17/2007   Irritable bowel syndrome 09/17/2007   Mixed hyperlipidemia    Pain in left knee 11/11/2017   Pain in right knee 02/02/2017   Palpitations    Personal history of colonic polyps 09/17/2007   hyperplastic   Personal history of malignant neoplasm of other parts of uterus 09/17/2007   Supraventricular tachycardia     Allergies  Allergen Reactions   Amoxicillin Other (See Comments)   Amoxicillin-Pot Clavulanate Other (See Comments)   Cefuroxime Other (See Comments)   Influenza Vac Split Quad Other (See Comments)   Levofloxacin Other (See Comments)   Paroxetine Hcl Other (See Comments)   Pneumococcal 13-Val Conj Vacc Other (See Comments)   Quinolones Other (See Comments)   Sulfa Antibiotics Other (See Comments)   Topiramate Other (See Comments)     OBJECTIVE General Patient is awake, alert, and oriented x 3 and in no acute distress. Derm Skin is dry and supple bilateral. Negative open lesions or macerations. Remaining  integument unremarkable. Nails are tender, long, thickened and dystrophic with subungual debris, consistent with onychomycosis, 1-5 bilateral. No signs of infection noted. Vasc  DP and PT pedal pulses palpable bilaterally. Temperature gradient within normal limits.  Neuro Epicritic and protective threshold sensation grossly intact bilaterally.  Musculoskeletal Exam No symptomatic pedal deformities noted bilateral. Muscular strength within normal limits.  ASSESSMENT 1.  Pain due to onychomycosis of toenails both  PLAN OF CARE 1. Patient evaluated today.  2. Instructed to maintain good pedal hygiene and foot care.  3. Mechanical debridement of nails 1-5 bilaterally performed using a nail nipper. Filed with dremel without incident.  4. Return to clinic in 3 mos.    Felecia Shelling, DPM [facility]  Dr. Felecia Shelling, DPM    [address] [place], [place] [address]                Office [phone]  Fax [phone]

## 2023-03-23 ENCOUNTER — Ambulatory Visit: Payer: PPO | Admitting: Podiatry

## 2023-05-27 ENCOUNTER — Encounter: Payer: Self-pay | Admitting: Podiatrist

## 2023-05-27 ENCOUNTER — Ambulatory Visit (INDEPENDENT_AMBULATORY_CARE_PROVIDER_SITE_OTHER)

## 2023-05-27 ENCOUNTER — Ambulatory Visit: Admitting: Podiatrist

## 2023-05-27 DIAGNOSIS — B351 Tinea unguium: Secondary | ICD-10-CM | POA: Diagnosis not present

## 2023-05-27 DIAGNOSIS — M79609 Pain in unspecified limb: Secondary | ICD-10-CM | POA: Diagnosis not present

## 2023-05-27 DIAGNOSIS — M7752 Other enthesopathy of left foot: Secondary | ICD-10-CM

## 2023-05-27 DIAGNOSIS — M778 Other enthesopathies, not elsewhere classified: Secondary | ICD-10-CM

## 2023-05-27 NOTE — Progress Notes (Signed)
   Chief Complaint  Patient presents with   Toe Pain   Callouses    Left foot callouse plantar. Thick yellow fungus nails. Son noticed 1 week ago. Pain when removing shoe. Dementia patient. Son requested provider assess foot before XR performed. Provider informed of this.     SUBJECTIVE Patient PMHx end-stage Alzheimer's presenting with her son today presents to office today with the concern of pain in her toes/ feet.  He relates she winces when he removes her shoes and he wonders if the toenails have gotten too long or the callus is causing pain.  She is unable to communicate her area of discomfort.   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   History of urinary diversion procedure 07/21/2019   Hypercholesterolemia    Insomnia    Interstitial cystitis 09/17/2007   Irritable bowel syndrome 09/17/2007   Mixed hyperlipidemia    Pain in left knee 11/11/2017   Pain in right knee 02/02/2017   Palpitations    Personal history of colonic polyps 09/17/2007   hyperplastic   Personal history of malignant neoplasm of other parts of uterus 09/17/2007   Supraventricular tachycardia     Allergies  Allergen Reactions   Amoxicillin Other (See Comments)   Amoxicillin-Pot Clavulanate Other (See Comments)   Cefuroxime Other (See Comments)   Influenza Vac Split Quad Other (See Comments)   Levofloxacin  Other (See Comments)   Paroxetine Hcl Other (See Comments)   Pneumococcal 13-Val Conj Vacc Other (See Comments)   Quinolones Other (See Comments)   Sulfa Antibiotics Other (See Comments)   Topiramate Other (See Comments)     OBJECTIVE General Patient is awake, alert, and oriented x 3 and in no  acute distress. Derm Skin is dry and supple bilateral. Negative open lesions or macerations. Remaining integument unremarkable. Nails are tender, long, thickened and dystrophic with subungual debris, consistent with onychomycosis, 1-5 bilateral. No signs of infection noted.  Well circumscribed callus submetatarsal 1 left foot noted.   Vasc  DP and PT pedal pulses palpable bilaterally. Temperature gradient within normal limits.  Neuro Epicritic and protective threshold sensation grossly intact bilaterally.  Musculoskeletal Exam No symptomatic pedal deformities noted bilateral. Decreased range of motion first mpj right.  Normal rom left first mpj present.  Swan neck deformities of toes 2,3,4 left noted.   ASSESSMENT 1.  Pain due to onychomycosis of toenails both  PLAN OF CARE 1. Patient evaluated today.  2. Pared the callus submet 1 right with a 15 blade.  3. Mechanical debridement of nails 1-5 bilaterally performed using a nail nipper. Filed with dremel without incident.  4. Recommended soft, supportive shoes to help reduce friction from walking.  -  she walks a lot during the day.   4. Return to clinic in 62 days.

## 2023-06-09 ENCOUNTER — Encounter: Payer: Self-pay | Admitting: Physician Assistant

## 2023-06-16 ENCOUNTER — Ambulatory Visit: Payer: PPO | Admitting: Podiatry

## 2023-07-13 ENCOUNTER — Emergency Department (HOSPITAL_BASED_OUTPATIENT_CLINIC_OR_DEPARTMENT_OTHER)
Admission: EM | Admit: 2023-07-13 | Discharge: 2023-07-13 | Disposition: A | Discharge status: Home / Self Care | Attending: Emergency Medicine | Admitting: Emergency Medicine

## 2023-07-13 ENCOUNTER — Encounter (HOSPITAL_BASED_OUTPATIENT_CLINIC_OR_DEPARTMENT_OTHER): Payer: Self-pay

## 2023-07-13 ENCOUNTER — Other Ambulatory Visit (HOSPITAL_BASED_OUTPATIENT_CLINIC_OR_DEPARTMENT_OTHER): Payer: Self-pay

## 2023-07-13 ENCOUNTER — Emergency Department (HOSPITAL_BASED_OUTPATIENT_CLINIC_OR_DEPARTMENT_OTHER): Admitting: Radiology

## 2023-07-13 ENCOUNTER — Other Ambulatory Visit: Payer: Self-pay

## 2023-07-13 DIAGNOSIS — J181 Lobar pneumonia, unspecified organism: Secondary | ICD-10-CM | POA: Insufficient documentation

## 2023-07-13 DIAGNOSIS — R531 Weakness: Secondary | ICD-10-CM | POA: Insufficient documentation

## 2023-07-13 DIAGNOSIS — I10 Essential (primary) hypertension: Secondary | ICD-10-CM | POA: Insufficient documentation

## 2023-07-13 DIAGNOSIS — R051 Acute cough: Secondary | ICD-10-CM | POA: Diagnosis present

## 2023-07-13 DIAGNOSIS — Z79899 Other long term (current) drug therapy: Secondary | ICD-10-CM | POA: Diagnosis not present

## 2023-07-13 DIAGNOSIS — Z8542 Personal history of malignant neoplasm of other parts of uterus: Secondary | ICD-10-CM | POA: Insufficient documentation

## 2023-07-13 DIAGNOSIS — J189 Pneumonia, unspecified organism: Secondary | ICD-10-CM

## 2023-07-13 DIAGNOSIS — F0390 Unspecified dementia without behavioral disturbance: Secondary | ICD-10-CM | POA: Insufficient documentation

## 2023-07-13 LAB — COMPREHENSIVE METABOLIC PANEL WITH GFR
ALT: 10 U/L (ref 0–44)
AST: 18 U/L (ref 15–41)
Albumin: 3.6 g/dL (ref 3.5–5.0)
Alkaline Phosphatase: 97 U/L (ref 38–126)
Anion gap: 11 (ref 5–15)
BUN: 21 mg/dL (ref 8–23)
CO2: 25 mmol/L (ref 22–32)
Calcium: 9.2 mg/dL (ref 8.9–10.3)
Chloride: 104 mmol/L (ref 98–111)
Creatinine, Ser: 1.03 mg/dL — ABNORMAL HIGH (ref 0.44–1.00)
GFR, Estimated: 54 mL/min — ABNORMAL LOW (ref >60)
Glucose, Bld: 94 mg/dL (ref 70–99)
Potassium: 4.5 mmol/L (ref 3.5–5.1)
Sodium: 140 mmol/L (ref 135–145)
Total Bilirubin: 0.4 mg/dL (ref 0.0–1.2)
Total Protein: 6.1 g/dL — ABNORMAL LOW (ref 6.5–8.1)

## 2023-07-13 LAB — CBC WITH DIFFERENTIAL/PLATELET
Abs Immature Granulocytes: 0.02 10*3/uL (ref 0.00–0.07)
Basophils Absolute: 0 10*3/uL (ref 0.0–0.1)
Basophils Relative: 0 %
Eosinophils Absolute: 0.1 10*3/uL (ref 0.0–0.5)
Eosinophils Relative: 1 %
HCT: 36.1 % (ref 36.0–46.0)
Hemoglobin: 11.4 g/dL — ABNORMAL LOW (ref 12.0–15.0)
Immature Granulocytes: 0 %
Lymphocytes Relative: 10 %
Lymphs Abs: 0.8 10*3/uL (ref 0.7–4.0)
MCH: 28 pg (ref 26.0–34.0)
MCHC: 31.6 g/dL (ref 30.0–36.0)
MCV: 88.7 fL (ref 80.0–100.0)
Monocytes Absolute: 0.8 10*3/uL (ref 0.1–1.0)
Monocytes Relative: 10 %
Neutro Abs: 6.3 10*3/uL (ref 1.7–7.7)
Neutrophils Relative %: 79 %
Platelets: 266 10*3/uL (ref 150–400)
RBC: 4.07 MIL/uL (ref 3.87–5.11)
RDW: 13.9 % (ref 11.5–15.5)
WBC: 8 10*3/uL (ref 4.0–10.5)
nRBC: 0 % (ref 0.0–0.2)

## 2023-07-13 LAB — RESP PANEL BY RT-PCR (RSV, FLU A&B, COVID)  RVPGX2
Influenza A by PCR: NEGATIVE
Influenza B by PCR: NEGATIVE
Resp Syncytial Virus by PCR: NEGATIVE
SARS Coronavirus 2 by RT PCR: NEGATIVE

## 2023-07-13 LAB — PRO BRAIN NATRIURETIC PEPTIDE: Pro Brain Natriuretic Peptide: 426 pg/mL — ABNORMAL HIGH (ref <300.0)

## 2023-07-13 LAB — TROPONIN T, HIGH SENSITIVITY: Troponin T High Sensitivity: 18 ng/L (ref <19)

## 2023-07-13 LAB — LACTIC ACID, PLASMA: Lactic Acid, Venous: 1.1 mmol/L (ref 0.5–1.9)

## 2023-07-13 MED ORDER — DOXYCYCLINE HYCLATE 100 MG PO CAPS: 100.0000 mg | ORAL_CAPSULE | Freq: Two times a day (BID) | ORAL | 0 refills | Status: AC

## 2023-07-13 MED ORDER — DOXYCYCLINE HYCLATE 100 MG PO CAPS
100.0000 mg | ORAL_CAPSULE | Freq: Two times a day (BID) | ORAL | 0 refills | Status: AC
  Filled 2023-07-13: qty 14, 7d supply, fill #0

## 2023-07-13 NOTE — ED Notes (Signed)
 First set of Cultures sent to the lab... Not currently ordered.Kelly AasAaron Solomon

## 2023-07-13 NOTE — ED Triage Notes (Signed)
 In for eval of cough, fatigue, nasal congestion, and weakness, onset last week or 2. OTC Robitussin.

## 2023-07-13 NOTE — ED Notes (Signed)
 DC paperwork given and verbally understood.

## 2023-07-13 NOTE — Discharge Instructions (Addendum)
 Take the doxycycline 100mg  (1 capsule) twice a day for 7 days.

## 2023-07-13 NOTE — ED Provider Notes (Signed)
 [place] [facility] AT [facility] Provider Note   CSN: [id number] Arrival date & time: 07/13/23  2841     Patient presents with: Cough   [name] is a 83 y.o. female.   HPI      83 year old female with a history of dementia likely secondary to Alzheimer's disease, IBS, fibromyalgia, generalized anxiety disorder, hyperlipidemia, history of interstitial cystitis.cystectomy and urinary diversion procedure, who presents with concern for generalized weakness and cough.   Generalized weakness, cough Giving robitussin Facility also with sick contacts with cough, viral infections A few months ago needed abx No notable dyspnea No fever No nausea or vomiting Loose stool, not black or bloody, is dark brown   Assisted living, but doesn't say when she is having pain, knows son, understands some commands but verbal communication less  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   History of urinary diversion procedure 07/21/2019   Hypercholesterolemia    Insomnia    Interstitial cystitis 09/17/2007   Irritable bowel syndrome 09/17/2007   Mixed hyperlipidemia    Pain in left knee 11/11/2017   Pain in right knee 02/02/2017   Palpitations    Personal history of colonic polyps 09/17/2007   hyperplastic   Personal history of malignant neoplasm of other parts of uterus 09/17/2007   Supraventricular tachycardia     Past Surgical History:  Procedure Laterality Date   ABDOMINAL HYSTERECTOMY     BACK SURGERY     L5   BLADDER SURGERY     CATARACT EXTRACTION     COLONOSCOPY  2012   normal    ESOPHAGOGASTRODUODENOSCOPY  2007   normal   TONSILLECTOMY       Prior to Admission medications   Medication Sig Start Date End Date Taking? Authorizing Provider  doxycycline (VIBRAMYCIN) 100 MG capsule Take 1 capsule (100 mg total) by mouth 2 (two) times daily for 7 days. 07/13/23 07/20/23 Yes Scarlette Currier, MD  calcium carbonate (OSCAL) 1500 (600 Ca) MG TABS tablet Take 2 tablets by mouth 2 (two) times daily with a meal.    [provider]  citalopram (CELEXA) 10 MG tablet Take 10 mg by mouth daily. 10/07/20   [provider]  clonazePAM (KLONOPIN) 0.5 MG tablet Take 0.5 mg by mouth daily as needed for anxiety (anxiety).    [provider]  dicyclomine  (BENTYL ) 10 MG capsule Take 1 capsule (10 mg total) by mouth 2 times daily at 12 noon and 4 pm. 03/17/16   Albertina Hugger, MD  divalproex  (DEPAKOTE ) 125 MG DR tablet TAKE 1 TABLET BY MOUTH DAILY. 10/10/22   Wertman, Sara E, PA-C  metoprolol  succinate (TOPROL -XL) 50 MG 24 hr tablet Take 0.5 tablets (25 mg total) by mouth daily. 01/19/23   Ninetta Basket, MD  nitrofurantoin (MACRODANTIN) 50 MG capsule Take 50 mg by mouth See admin instructions. Every 3 to 4 days.    [provider]  traZODone  (DESYREL ) 50 MG tablet Take 0.5 tablets (25 mg total) by mouth at bedtime. 01/19/23   Ninetta Basket, MD    Allergies: Amoxicillin, Amoxicillin-pot clavulanate, Cefuroxime, Influenza vac split quad, Levofloxacin , Paroxetine hcl, Pneumococcal  13-val conj vacc, Quinolones, Sulfa antibiotics, and Topiramate    Review of Systems  Updated Vital Signs BP (!) 155/48 (BP Location: Right Arm)   Pulse (!) 52   Temp 98 F (36.7 C) (Oral)   Resp 17   Ht 5' 5 (1.651 m)   Wt 60.6 kg   SpO2 97%   BMI 22.25 kg/m   Physical Exam Vitals and nursing note reviewed.  Constitutional:      General: She is not in acute distress.    Appearance: She is well-developed. She is not diaphoretic.  HENT:     Head: Normocephalic and atraumatic.   Eyes:     Conjunctiva/sclera: Conjunctivae  normal.    Cardiovascular:     Rate and Rhythm: Normal rate and regular rhythm.     Heart sounds: Normal heart sounds. No murmur heard.    No friction rub. No gallop.  Pulmonary:     Effort: Pulmonary effort is normal. No respiratory distress.     Breath sounds: Rales (LLL) present. No wheezing.  Abdominal:     General: There is no distension.     Palpations: Abdomen is soft.     Tenderness: There is no abdominal tenderness. There is no guarding.   Musculoskeletal:        General: No tenderness.     Cervical back: Normal range of motion.   Skin:    General: Skin is warm and dry.     Findings: No erythema or rash.   Neurological:     Mental Status: She is alert.     Comments: States name Intermittently follows commands Symmetric movements, midline tongue    (all labs ordered are listed, but only abnormal results are displayed) Labs Reviewed  CBC WITH DIFFERENTIAL/PLATELET - Abnormal; Notable for the following components:      Result Value   Hemoglobin 11.4 (*)    All other components within normal limits  COMPREHENSIVE METABOLIC PANEL WITH GFR - Abnormal; Notable for the following components:   Creatinine, Ser 1.03 (*)    Total Protein 6.1 (*)    GFR, Estimated 54 (*)    All other components within normal limits  PRO BRAIN NATRIURETIC PEPTIDE - Abnormal; Notable for the following components:   Pro Brain Natriuretic Peptide 426.0 (*)    All other components within normal limits  RESP PANEL BY RT-PCR (RSV, FLU A&B, COVID)  RVPGX2  LACTIC ACID, PLASMA  TROPONIN T, HIGH SENSITIVITY    EKG: EKG Interpretation Date/Time:  Monday July 13 2023 07:56:56 EDT Ventricular Rate:  54 PR Interval:  155 QRS Duration:  92 QT Interval:  435 QTC Calculation: 413 R Axis:   74  Text Interpretation: Sinus rhythm No significant change since last tracing Confirmed by Scarlette Currier ([id number]) on 07/13/2023 8:22:51 AM  Radiology: [facility] Query Chest 2 View Result Date: 07/13/2023 CLINICAL  DATA:  Cough EXAM: CHEST - 2 VIEW COMPARISON:  January 18, 2023 FINDINGS: The heart size and mediastinal contours are within normal limits. Both lungs are clear. The visualized skeletal structures are unremarkable. IMPRESSION: No active cardiopulmonary disease. Electronically Signed   By: Fredrich Jefferson M.D.   On: 07/13/2023 08:40     Procedures   Medications Ordered in the ED - No data to display                                   83 year old female with a history of  dementia likely secondary to Alzheimer's disease, IBS, fibromyalgia, generalized anxiety disorder, hyperlipidemia, history of interstitial cystitis.cystectomy and urinary diversion procedure, who presents with concern for generalized weakness and cough.  Differential diagnosis includes anemia, electrolyte abnormality, arrhythmia, ACS, infection, including viral infection versus pneumonia.  She does not have focal neurologic symptoms and have low suspicion for CVA or ICH.  EKG completed and personally about interpreted by me shows a normal sinus rhythm without acute ST changes  Labs completed and personally eval and interpreted by me show mild anemia not changed from prior, no leukocytosis, CMP without clinically significant electrolyte abnormalities.  Creatinine is similar to prior.  Troponin is negative and have low suspicion for ACS.  Her lactic acid is negative.  Her proBNP is 426 which is very mildly elevated and essentially normal for her age group and do not feel her symptoms represent acute congestive heart failure.  COVID, flu, and RSV testing are negative.  Chest x-ray was completed and personally eval and interpreted by me showed no sign of pneumonia, pneumothorax or pulmonary edema.  On my exam, she did have some left lower lobe rhonchi and concern for possible occult pneumonia.  Using shared decision making with son, discussed possibility of continue to observe in the setting of suspected viral infection as etiology of  her fatigue and cough, however given her age, history and exam, feel empiric antibiotics for possible occult pneumonia is also reasonable.  Given a prescription for doxycycline for one week.  We were unable to obtain a urinary sample in the emergency department, however in the setting of cough, no urinary symptoms have low suspicion for this.      Final diagnoses:  Acute cough  Generalized weakness  Community acquired pneumonia of left lower lobe of lung    ED Discharge Orders          Ordered    doxycycline (VIBRAMYCIN) 100 MG capsule  2 times daily        07/13/23 0950               Scarlette Currier, MD 07/13/23 1008

## 2023-07-27 ENCOUNTER — Ambulatory Visit: Admitting: Podiatry

## 2023-07-27 DIAGNOSIS — M79675 Pain in left toe(s): Secondary | ICD-10-CM | POA: Diagnosis not present

## 2023-07-27 DIAGNOSIS — B351 Tinea unguium: Secondary | ICD-10-CM | POA: Diagnosis not present

## 2023-07-27 DIAGNOSIS — M79674 Pain in right toe(s): Secondary | ICD-10-CM | POA: Diagnosis not present

## 2023-07-27 NOTE — Progress Notes (Signed)
   No chief complaint on file.   SUBJECTIVE Patient PMHx end-stage Alzheimer's presenting with her son today presents to office today complaining of elongated, thickened nails that cause pain while ambulating in shoes.  Patient is unable to trim their own nails. Patient is here for further evaluation and treatment.  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   History of urinary diversion procedure 07/21/2019   Hypercholesterolemia    Insomnia    Interstitial cystitis 09/17/2007   Irritable bowel syndrome 09/17/2007   Mixed hyperlipidemia    Pain in left knee 11/11/2017   Pain in right knee 02/02/2017   Palpitations    Personal history of colonic polyps 09/17/2007   hyperplastic   Personal history of malignant neoplasm of other parts of uterus 09/17/2007   Supraventricular tachycardia     Allergies  Allergen Reactions   Amoxicillin Other (See Comments)   Amoxicillin-Pot Clavulanate Other (See Comments)   Cefuroxime Other (See Comments)   Influenza Vac Split Quad Other (See Comments)   Levofloxacin  Other (See Comments)   Paroxetine Hcl Other (See Comments)   Pneumococcal 13-Val Conj Vacc Other (See Comments)   Quinolones Other (See Comments)   Sulfa Antibiotics Other (See Comments)   Topiramate Other (See Comments)     OBJECTIVE General Patient is awake, alert, and oriented x 3 and in no acute distress. Derm Skin is dry and supple bilateral. Negative open lesions or macerations. Remaining integument unremarkable. Nails are tender, long, thickened and dystrophic with subungual debris, consistent with onychomycosis, 1-5 bilateral. No signs of infection noted. Vasc  DP and PT  pedal pulses palpable bilaterally. Temperature gradient within normal limits.  Neuro Epicritic and protective threshold sensation grossly intact bilaterally.  Musculoskeletal Exam No symptomatic pedal deformities noted bilateral. Muscular strength within normal limits.  ASSESSMENT 1.  Pain due to onychomycosis of toenails both  PLAN OF CARE 1. Patient evaluated today.  2. Instructed to maintain good pedal hygiene and foot care.  3. Mechanical debridement of nails 1-5 bilaterally performed using a nail nipper. Filed with dremel without incident.  4. Return to clinic in 3 mos.    Thresa EMERSON Sar, DPM [facility]  Dr. Thresa EMERSON Sar, DPM    [address] [place], [place] [address]                Office [phone]  Fax [phone]

## 2023-09-16 ENCOUNTER — Other Ambulatory Visit: Payer: Self-pay

## 2023-09-16 ENCOUNTER — Emergency Department (HOSPITAL_COMMUNITY)

## 2023-09-16 ENCOUNTER — Encounter (HOSPITAL_COMMUNITY): Payer: Self-pay

## 2023-09-16 ENCOUNTER — Inpatient Hospital Stay (HOSPITAL_COMMUNITY)
Admission: EM | Admit: 2023-09-16 | Discharge: 2023-09-19 | DRG: 698 | Disposition: A | Discharge status: Home Health | Source: Skilled Nursing Facility | Attending: Internal Medicine | Admitting: Internal Medicine

## 2023-09-16 DIAGNOSIS — F411 Generalized anxiety disorder: Secondary | ICD-10-CM | POA: Diagnosis present

## 2023-09-16 DIAGNOSIS — R339 Retention of urine, unspecified: Secondary | ICD-10-CM | POA: Diagnosis present

## 2023-09-16 DIAGNOSIS — A4151 Sepsis due to Escherichia coli [E. coli]: Secondary | ICD-10-CM | POA: Diagnosis present

## 2023-09-16 DIAGNOSIS — M797 Fibromyalgia: Secondary | ICD-10-CM | POA: Diagnosis present

## 2023-09-16 DIAGNOSIS — Z882 Allergy status to sulfonamides status: Secondary | ICD-10-CM | POA: Diagnosis not present

## 2023-09-16 DIAGNOSIS — Z87440 Personal history of urinary (tract) infections: Secondary | ICD-10-CM | POA: Diagnosis not present

## 2023-09-16 DIAGNOSIS — R6520 Severe sepsis without septic shock: Secondary | ICD-10-CM | POA: Diagnosis present

## 2023-09-16 DIAGNOSIS — R7989 Other specified abnormal findings of blood chemistry: Secondary | ICD-10-CM | POA: Diagnosis present

## 2023-09-16 DIAGNOSIS — G309 Alzheimer's disease, unspecified: Secondary | ICD-10-CM | POA: Diagnosis present

## 2023-09-16 DIAGNOSIS — N390 Urinary tract infection, site not specified: Secondary | ICD-10-CM | POA: Diagnosis not present

## 2023-09-16 DIAGNOSIS — A419 Sepsis, unspecified organism: Secondary | ICD-10-CM | POA: Diagnosis present

## 2023-09-16 DIAGNOSIS — N3000 Acute cystitis without hematuria: Secondary | ICD-10-CM | POA: Diagnosis not present

## 2023-09-16 DIAGNOSIS — Z83719 Family history of colon polyps, unspecified: Secondary | ICD-10-CM

## 2023-09-16 DIAGNOSIS — Z881 Allergy status to other antibiotic agents status: Secondary | ICD-10-CM

## 2023-09-16 DIAGNOSIS — E782 Mixed hyperlipidemia: Secondary | ICD-10-CM | POA: Diagnosis present

## 2023-09-16 DIAGNOSIS — T83593A Infection and inflammatory reaction due to other urinary stents, initial encounter: Secondary | ICD-10-CM | POA: Diagnosis present

## 2023-09-16 DIAGNOSIS — Z8542 Personal history of malignant neoplasm of other parts of uterus: Secondary | ICD-10-CM

## 2023-09-16 DIAGNOSIS — F0280 Dementia in other diseases classified elsewhere without behavioral disturbance: Secondary | ICD-10-CM | POA: Diagnosis present

## 2023-09-16 DIAGNOSIS — I471 Supraventricular tachycardia, unspecified: Secondary | ICD-10-CM | POA: Diagnosis present

## 2023-09-16 DIAGNOSIS — Z880 Allergy status to penicillin: Secondary | ICD-10-CM

## 2023-09-16 DIAGNOSIS — E872 Acidosis, unspecified: Secondary | ICD-10-CM | POA: Diagnosis present

## 2023-09-16 DIAGNOSIS — Z808 Family history of malignant neoplasm of other organs or systems: Secondary | ICD-10-CM | POA: Diagnosis not present

## 2023-09-16 DIAGNOSIS — E876 Hypokalemia: Secondary | ICD-10-CM | POA: Diagnosis not present

## 2023-09-16 DIAGNOSIS — I10 Essential (primary) hypertension: Secondary | ICD-10-CM | POA: Diagnosis present

## 2023-09-16 DIAGNOSIS — F0284 Dementia in other diseases classified elsewhere, unspecified severity, with anxiety: Secondary | ICD-10-CM | POA: Diagnosis present

## 2023-09-16 DIAGNOSIS — R4182 Altered mental status, unspecified: Secondary | ICD-10-CM

## 2023-09-16 DIAGNOSIS — Z803 Family history of malignant neoplasm of breast: Secondary | ICD-10-CM

## 2023-09-16 DIAGNOSIS — Z888 Allergy status to other drugs, medicaments and biological substances status: Secondary | ICD-10-CM

## 2023-09-16 DIAGNOSIS — N3011 Interstitial cystitis (chronic) with hematuria: Secondary | ICD-10-CM | POA: Diagnosis present

## 2023-09-16 DIAGNOSIS — M1711 Unilateral primary osteoarthritis, right knee: Secondary | ICD-10-CM | POA: Diagnosis present

## 2023-09-16 DIAGNOSIS — K589 Irritable bowel syndrome without diarrhea: Secondary | ICD-10-CM | POA: Diagnosis present

## 2023-09-16 DIAGNOSIS — Z887 Allergy status to serum and vaccine status: Secondary | ICD-10-CM | POA: Diagnosis not present

## 2023-09-16 DIAGNOSIS — Z79899 Other long term (current) drug therapy: Secondary | ICD-10-CM

## 2023-09-16 DIAGNOSIS — R531 Weakness: Principal | ICD-10-CM

## 2023-09-16 DIAGNOSIS — Z860102 Personal history of hyperplastic colon polyps: Secondary | ICD-10-CM

## 2023-09-16 DIAGNOSIS — Z90710 Acquired absence of both cervix and uterus: Secondary | ICD-10-CM

## 2023-09-16 DIAGNOSIS — Z801 Family history of malignant neoplasm of trachea, bronchus and lung: Secondary | ICD-10-CM

## 2023-09-16 DIAGNOSIS — N179 Acute kidney failure, unspecified: Secondary | ICD-10-CM | POA: Diagnosis present

## 2023-09-16 DIAGNOSIS — Z906 Acquired absence of other parts of urinary tract: Secondary | ICD-10-CM

## 2023-09-16 LAB — CBC WITH DIFFERENTIAL/PLATELET
Abs Immature Granulocytes: 0.07 K/uL (ref 0.00–0.07)
Basophils Absolute: 0.1 K/uL (ref 0.0–0.1)
Basophils Relative: 0 %
Eosinophils Absolute: 0 K/uL (ref 0.0–0.5)
Eosinophils Relative: 0 %
HCT: 44.6 % (ref 36.0–46.0)
Hemoglobin: 13.9 g/dL (ref 12.0–15.0)
Immature Granulocytes: 1 %
Lymphocytes Relative: 3 %
Lymphs Abs: 0.5 K/uL — ABNORMAL LOW (ref 0.7–4.0)
MCH: 27.7 pg (ref 26.0–34.0)
MCHC: 31.2 g/dL (ref 30.0–36.0)
MCV: 88.8 fL (ref 80.0–100.0)
Monocytes Absolute: 1.1 K/uL — ABNORMAL HIGH (ref 0.1–1.0)
Monocytes Relative: 7 %
Neutro Abs: 13.3 K/uL — ABNORMAL HIGH (ref 1.7–7.7)
Neutrophils Relative %: 89 %
Platelets: 380 K/uL (ref 150–400)
RBC: 5.02 MIL/uL (ref 3.87–5.11)
RDW: 13.4 % (ref 11.5–15.5)
WBC: 15 K/uL — ABNORMAL HIGH (ref 4.0–10.5)
nRBC: 0 % (ref 0.0–0.2)

## 2023-09-16 LAB — COMPREHENSIVE METABOLIC PANEL WITH GFR
ALT: 16 U/L (ref 0–44)
AST: 24 U/L (ref 15–41)
Albumin: 3.7 g/dL (ref 3.5–5.0)
Alkaline Phosphatase: 104 U/L (ref 38–126)
Anion gap: 12 (ref 5–15)
BUN: 10 mg/dL (ref 8–23)
CO2: 21 mmol/L — ABNORMAL LOW (ref 22–32)
Calcium: 9.3 mg/dL (ref 8.9–10.3)
Chloride: 100 mmol/L (ref 98–111)
Creatinine, Ser: 1.37 mg/dL — ABNORMAL HIGH (ref 0.44–1.00)
GFR, Estimated: 39 mL/min — ABNORMAL LOW (ref >60)
Glucose, Bld: 162 mg/dL — ABNORMAL HIGH (ref 70–99)
Potassium: 5.5 mmol/L — ABNORMAL HIGH (ref 3.5–5.1)
Sodium: 133 mmol/L — ABNORMAL LOW (ref 135–145)
Total Bilirubin: 0.9 mg/dL (ref 0.0–1.2)
Total Protein: 7.2 g/dL (ref 6.5–8.1)

## 2023-09-16 LAB — URINALYSIS, W/ REFLEX TO CULTURE (INFECTION SUSPECTED)
Bilirubin Urine: NEGATIVE
Glucose, UA: NEGATIVE mg/dL
Ketones, ur: NEGATIVE mg/dL
Nitrite: NEGATIVE
Protein, ur: >=300 mg/dL — AB
RBC / HPF: >50 RBC/hpf (ref 0–5)
Specific Gravity, Urine: 1.011 (ref 1.005–1.030)
WBC, UA: >50 WBC/hpf (ref 0–5)
pH: 9 — ABNORMAL HIGH (ref 5.0–8.0)

## 2023-09-16 LAB — I-STAT CG4 LACTIC ACID, ED: Lactic Acid, Venous: 2.7 mmol/L (ref 0.5–1.9)

## 2023-09-16 LAB — LACTIC ACID, PLASMA
Lactic Acid, Venous: 2.7 mmol/L (ref 0.5–1.9)
Lactic Acid, Venous: 2.3 mmol/L (ref 0.5–1.9)

## 2023-09-16 MED ORDER — ENOXAPARIN SODIUM 30 MG/0.3ML IJ SOSY
30.0000 mg | PREFILLED_SYRINGE | INTRAMUSCULAR | Status: DC
  Administered 2023-09-16: 30 mg via SUBCUTANEOUS
  Filled 2023-09-16: qty 0.3

## 2023-09-16 MED ORDER — SODIUM CHLORIDE 0.9 % IV BOLUS
1000.0000 mL | Freq: Once | INTRAVENOUS | Status: AC
  Administered 2023-09-16: 1000 mL via INTRAVENOUS

## 2023-09-16 MED ORDER — SODIUM CHLORIDE 0.9 % IV SOLN: INTRAVENOUS | Status: AC

## 2023-09-16 MED ORDER — SODIUM CHLORIDE 0.9 % IV SOLN
1.0000 g | INTRAVENOUS | Status: DC
  Administered 2023-09-17 – 2023-09-18 (×2): 1 g via INTRAVENOUS
  Filled 2023-09-16 (×2): qty 10

## 2023-09-16 MED ORDER — CITALOPRAM HYDROBROMIDE 20 MG PO TABS
10.0000 mg | ORAL_TABLET | Freq: Every day | ORAL | Status: DC
  Administered 2023-09-16 – 2023-09-19 (×4): 10 mg via ORAL
  Filled 2023-09-16 (×4): qty 1

## 2023-09-16 MED ORDER — ONDANSETRON HCL 4 MG/2ML IJ SOLN
4.0000 mg | Freq: Four times a day (QID) | INTRAMUSCULAR | Status: DC | PRN
  Administered 2023-09-19: 4 mg via INTRAVENOUS
  Filled 2023-09-16: qty 2

## 2023-09-16 MED ORDER — ONDANSETRON HCL 4 MG PO TABS: 4.0000 mg | ORAL_TABLET | Freq: Four times a day (QID) | ORAL | Status: DC | PRN

## 2023-09-16 MED ORDER — CLONAZEPAM 0.5 MG PO TABS
0.5000 mg | ORAL_TABLET | Freq: Every day | ORAL | Status: DC
  Administered 2023-09-16 – 2023-09-18 (×3): 0.5 mg via ORAL
  Filled 2023-09-16 (×3): qty 1

## 2023-09-16 MED ORDER — SODIUM ZIRCONIUM CYCLOSILICATE 10 G PO PACK
10.0000 g | PACK | Freq: Once | ORAL | Status: DC
  Filled 2023-09-16: qty 1

## 2023-09-16 MED ORDER — METOPROLOL SUCCINATE ER 25 MG PO TB24
25.0000 mg | ORAL_TABLET | Freq: Every day | ORAL | Status: DC
  Administered 2023-09-17 – 2023-09-19 (×3): 25 mg via ORAL
  Filled 2023-09-16 (×3): qty 1

## 2023-09-16 MED ORDER — SODIUM CHLORIDE 0.9 % IV SOLN
1.0000 g | Freq: Once | INTRAVENOUS | Status: AC
  Administered 2023-09-16: 1 g via INTRAVENOUS
  Filled 2023-09-16: qty 10

## 2023-09-16 MED ORDER — ACETAMINOPHEN 650 MG RE SUPP: 650.0000 mg | Freq: Four times a day (QID) | RECTAL | Status: DC | PRN

## 2023-09-16 MED ORDER — TRAZODONE HCL 100 MG PO TABS
100.0000 mg | ORAL_TABLET | Freq: Every day | ORAL | Status: DC
  Administered 2023-09-16: 100 mg via ORAL
  Filled 2023-09-16: qty 1

## 2023-09-16 MED ORDER — ACETAMINOPHEN 325 MG PO TABS
650.0000 mg | ORAL_TABLET | Freq: Four times a day (QID) | ORAL | Status: DC | PRN
  Administered 2023-09-18: 650 mg via ORAL
  Filled 2023-09-16: qty 2

## 2023-09-16 NOTE — H&P (Signed)
 History and Physical    Patient: [name] FMW:[id number] DOB: Mar 06, 1940 DOA: 09/16/2023 DOS: the patient was seen and examined on 09/16/2023 PCP: Vernon Velna SAUNDERS, MD  Patient coming from: ALF/ILF  Chief Complaint:  Chief Complaint  Patient presents with   Weakness   HPI: [name] is a 83 y.o. female with medical history significant of abdominal pain, rhinitis, osteoarthritis of the right knee, cataract, dementia due to Alzheimer's disease, essential hypertension, fibromyalgia, generalized anxiety disorder, interstitial cystitis, history of recurrent UTIs, history of total cystectomy, ileal conduit, IBS, mixed hyperlipidemia, colon polyps, uterine neoplasm, supraventricular tachycardia who was brought from the memory unit at her assisted living facility due to generalized weakness and an episode of emesis according to what the facility told her son.  She has been treated recently for UTI.  She is unable to provide further history.  Lab work: Urinalysis was red with cloudy appearance, pH of 9.0, no other hemoglobin, greater than 300 protein, large leukocyte esterase.  Greater than 50 RBC, greater than 50 WBC and many bacteria.  CBC showed white count 15.0, hemoglobin 13.9 g/dL platelets 619.  CMP showed a corrected sodium of 134, potassium 5.5, chloride 100 and CO2 21 mmol/L with a normal anion gap.  Glucose under 62, BUN 10 and creatinine 1.37 mg/dL.  Normal calcium level and LFTs.  Imaging: CT head without contrast with no acute intracranial abnormality.   ED course: Initial vital signs were temperature 97.3 F, pulse 80, respirations 17, BP 154/61 mmHg O2 sat 97% on room air.  The patient received ceftriaxone  1 g IVPB and 1000 mL normal saline bolus.  Review of Systems: As mentioned in the history of present illness. All other systems reviewed and are negative. Past Medical History:  Diagnosis Date   Abdominal pain 03/17/2016   Allergic rhinitis    Arthritis of right knee  02/02/2017   Cataract    Dementia due to Alzheimer's disease 11/08/2020   Essential hypertension    Fibromyalgia    Generalized anxiety disorder    History of recurrent UTIs 02/26/2011   History of total cystectomy 05/19/2012   History of urinary diversion procedure 07/21/2019   Hypercholesterolemia    Insomnia    Interstitial cystitis 09/17/2007   Irritable bowel syndrome 09/17/2007   Mixed hyperlipidemia    Pain in left knee 11/11/2017   Pain in right knee 02/02/2017   Palpitations    Personal history of colonic polyps 09/17/2007   hyperplastic   Personal history of malignant neoplasm of other parts of uterus 09/17/2007   Supraventricular tachycardia    Past Surgical History:  Procedure Laterality Date   ABDOMINAL HYSTERECTOMY     BACK SURGERY     L5   BLADDER SURGERY     CATARACT EXTRACTION     COLONOSCOPY  2012   normal    ESOPHAGOGASTRODUODENOSCOPY  2007   normal   TONSILLECTOMY     Social History:  reports that she has never smoked. She has never used smokeless tobacco. She reports current alcohol use. She reports that she does not use drugs.  Allergies  Allergen Reactions   Amoxicillin Other (See Comments)   Amoxicillin-Pot Clavulanate Other (See Comments)   Cefuroxime Other (See Comments)   Influenza Vac Split Quad Other (See Comments)   Levofloxacin  Other (See Comments)   Paroxetine Hcl Other (See Comments)   Pneumococcal 13-Val Conj Vacc Other (See Comments)   Quinolones Other (See Comments)   Sulfa Antibiotics Other (See Comments)  Topiramate Other (See Comments)    Family History  Problem Relation Age of Onset   Lung cancer Father        smoker   Breast cancer Sister    Colon polyps Sister    Dementia Sister    Cancer Brother        brain cancer   Breast cancer Maternal Aunt        cousin   Breast cancer Cousin    Colon cancer Neg Hx    Esophageal cancer Neg Hx    Stomach cancer Neg Hx     Prior to Admission medications   Medication  Sig Start Date End Date Taking? Authorizing Provider  calcium carbonate (OSCAL) 1500 (600 Ca) MG TABS tablet Take 2 tablets by mouth 2 (two) times daily with a meal.    [provider]  citalopram  (CELEXA ) 10 MG tablet Take 10 mg by mouth daily. 10/07/20   [provider]  clonazePAM  (KLONOPIN ) 0.5 MG tablet Take 0.5 mg by mouth daily as needed for anxiety (anxiety).    [provider]  dicyclomine  (BENTYL ) 10 MG capsule Take 1 capsule (10 mg total) by mouth 2 times daily at 12 noon and 4 pm. 03/17/16   Legrand Victory LITTIE DOUGLAS, MD  divalproex  (DEPAKOTE ) 125 MG DR tablet TAKE 1 TABLET BY MOUTH DAILY. 10/10/22   Wertman, Sara E, PA-C  metoprolol  succinate (TOPROL -XL) 50 MG 24 hr tablet Take 0.5 tablets (25 mg total) by mouth daily. 01/19/23   Yolande Lamar BROCKS, MD  nitrofurantoin (MACRODANTIN) 50 MG capsule Take 50 mg by mouth See admin instructions. Every 3 to 4 days.    [provider]  traZODone  (DESYREL ) 50 MG tablet Take 0.5 tablets (25 mg total) by mouth at bedtime. 01/19/23   Yolande Lamar BROCKS, MD    Physical Exam: Vitals:   09/16/23 1008 09/16/23 1319  BP:  (!) 154/61  Pulse:  81  Resp:  17  SpO2:  97%  Weight: 60.6 kg   Height: 5' 5 (1.651 m)    Physical Exam Vitals reviewed.  Constitutional:      General: She is awake. She is not in acute distress.    Appearance: She is ill-appearing.  HENT:     Head: Normocephalic.     Nose: No rhinorrhea.     Mouth/Throat:     Mouth: Mucous membranes are dry.  Eyes:     General: No scleral icterus.    Pupils: Pupils are equal, round, and reactive to light.  Neck:     Vascular: No JVD.  Cardiovascular:     Rate and Rhythm: Normal rate and regular rhythm.     Heart sounds: S1 normal and S2 normal.  Pulmonary:     Effort: Pulmonary effort is normal.     Breath sounds: Normal breath sounds. No wheezing, rhonchi or rales.  Abdominal:     General: Bowel sounds are normal. There is no distension.      Palpations: Abdomen is soft.     Tenderness: There is no abdominal tenderness. There is no right CVA tenderness or left CVA tenderness.  Musculoskeletal:     Cervical back: Neck supple.     Right lower leg: No edema.     Left lower leg: No edema.  Skin:    General: Skin is warm and dry.  Neurological:     General: No focal deficit present.     Mental Status: She is alert. She is disoriented.  Psychiatric:  Mood and Affect: Mood normal.        Behavior: Behavior is cooperative.     Data Reviewed:  Results are pending, will review when available. EKG: Vent. rate 82 BPM PR interval 141 ms QRS duration 77 ms QT/QTcB 389/455 ms P-R-T axes 73 62 67 Sinus rhythm Atrial premature complex  Assessment and Plan: Principal Problem:   Sepsis secondary to UTI (HCC) Admit to telemetry/inpatient. Continue IV fluids. Continue ceftriaxone  1 g IVPB daily. Follow-up lactic acid. Follow urine culture and sensitivity. Follow-up blood culture and sensitivity. Follow-up CBC and chemistry in the morning.  Active Problems:   Dementia due to Alzheimer's disease   Generalized anxiety disorder Supportive care. Continue trazodone  100 mg p.o. bedtime. Continue citalopram  10 mg p.o. daily. Continue clonazepam  0.5 mg at bedtime.    Essential hypertension Continue metoprolol  succinate 25 mg p.o. daily.    Mixed hyperlipidemia Follow-up with primary care provider.    Supraventricular tachycardia On metoprolol  as above.      Advance Care Planning:   Code Status: Full Code   Consults:   Family Communication: Her sons were at bedside.  Severity of Illness: The appropriate patient status for this patient is INPATIENT. Inpatient status is judged to be reasonable and necessary in order to provide the required intensity of service to ensure the patient's safety. The patient's presenting symptoms, physical exam findings, and initial radiographic and laboratory data in the context of  their chronic comorbidities is felt to place them at high risk for further clinical deterioration. Furthermore, it is not anticipated that the patient will be medically stable for discharge from the hospital within 2 midnights of admission.   * I certify that at the point of admission it is my clinical judgment that the patient will require inpatient hospital care spanning beyond 2 midnights from the point of admission due to high intensity of service, high risk for further deterioration and high frequency of surveillance required.*  Author: Alm Dorn Castor, MD 09/16/2023 1:55 PM  For on call review [email or web address].   This document was prepared using Dragon voice recognition software and may contain some unintended transcription errors.

## 2023-09-16 NOTE — ED Triage Notes (Signed)
 Recent UTI. Last antibiotic given Thursday. Did well over weekend. Declining started again Monday. Pt in memory care. Mostly non-verbal at baseline.

## 2023-09-16 NOTE — Plan of Care (Signed)

## 2023-09-16 NOTE — ED Notes (Signed)
 Pt. I-stat CG4 Lactic Acid results 2.68, EDP,Messick, MD made aware.

## 2023-09-16 NOTE — Progress Notes (Signed)
 Pt found to be sleeping but difficult to arouse. Pt responsive to sternal rub by briefly opening eyes and pushing RN hand away. She is not alert and awake enough to take scheduled lokelma  per MAR. All vitals are stable at baseline.   Per pt's son at bedside, pt had not been sleeping, so her home dose of trazadone was recently increased from 50mg  to 100mg . Pt also takes klonopin  at bedtime.  On-call NP notified at this time. Will continue to frequently monitor. Pt's son remains present at bedside.

## 2023-09-16 NOTE — ED Provider Notes (Signed)
 [place] EMERGENCY DEPARTMENT AT [facility] Provider Note   CSN: [id number] Arrival date & time: 09/16/23  9040     Patient presents with: Weakness   [name] is a 83 y.o. female.   83 year old female with prior medical history as detailed below presents for evaluation.  Patient arrives with EMS transport from memory care unit.  Patient's son noted the patient to be less responsive than normal.  Patient is minimally verbal at baseline.  Patient was treated for UTI last week.  She apparently improved.  Over the last 24 to 48 hours patient's family has reportedly noticed that she seems less responsive than her normal.  They requested ED evaluation today.   The history is provided by the patient and medical records.       Prior to Admission medications   Medication Sig Start Date End Date Taking? Authorizing Provider  calcium carbonate (OSCAL) 1500 (600 Ca) MG TABS tablet Take 2 tablets by mouth 2 (two) times daily with a meal.    [provider]  citalopram  (CELEXA ) 10 MG tablet Take 10 mg by mouth daily. 10/07/20   [provider]  clonazePAM  (KLONOPIN ) 0.5 MG tablet Take 0.5 mg by mouth daily as needed for anxiety (anxiety).    [provider]  dicyclomine  (BENTYL ) 10 MG capsule Take 1 capsule (10 mg total) by mouth 2 times daily at 12 noon and 4 pm. 03/17/16   Legrand Victory LITTIE DOUGLAS, MD  divalproex  (DEPAKOTE ) 125 MG DR tablet TAKE 1 TABLET BY MOUTH DAILY. 10/10/22   Wertman, Sara E, PA-C  metoprolol  succinate (TOPROL -XL) 50 MG 24 hr tablet Take 0.5 tablets (25 mg total) by mouth daily. 01/19/23   Yolande Lamar BROCKS, MD  nitrofurantoin (MACRODANTIN) 50 MG capsule Take 50 mg by mouth See admin instructions. Every 3 to 4 days.    [provider]  traZODone  (DESYREL ) 50 MG tablet Take 0.5 tablets (25 mg total) by mouth at bedtime. 01/19/23   Yolande Lamar BROCKS, MD    Allergies: Amoxicillin, Amoxicillin-pot clavulanate, Cefuroxime,  Influenza vac split quad, Levofloxacin , Paroxetine hcl, Pneumococcal 13-val conj vacc, Quinolones, Sulfa antibiotics, and Topiramate    Review of Systems  All other systems reviewed and are negative.   Updated Vital Signs Ht 5' 5 (1.651 m)   Wt 60.6 kg   BMI 22.23 kg/m   Physical Exam Vitals and nursing note reviewed.  Constitutional:      General: She is not in acute distress.    Appearance: Normal appearance. She is well-developed.  HENT:     Head: Normocephalic and atraumatic.  Eyes:     Conjunctiva/sclera: Conjunctivae normal.     Pupils: Pupils are equal, round, and reactive to light.  Cardiovascular:     Rate and Rhythm: Normal rate and regular rhythm.     Heart sounds: Normal heart sounds.  Pulmonary:     Effort: Pulmonary effort is normal. No respiratory distress.     Breath sounds: Normal breath sounds.  Abdominal:     General: There is no distension.     Palpations: Abdomen is soft.     Tenderness: There is no abdominal tenderness.  Musculoskeletal:        General: No deformity. Normal range of motion.     Cervical back: Normal range of motion and neck supple.  Skin:    General: Skin is warm and dry.  Neurological:     General: No focal deficit present.  Mental Status: She is alert and oriented to person, place, and time.     (all labs ordered are listed, but only abnormal results are displayed) Labs Reviewed  CBC WITH DIFFERENTIAL/PLATELET  COMPREHENSIVE METABOLIC PANEL WITH GFR  URINALYSIS, W/ REFLEX TO CULTURE (INFECTION SUSPECTED)    EKG: None  Radiology: No results found.   Procedures   Medications Ordered in the ED - No data to display                                  Medical Decision Making Patient is presenting with decreased mental status, family is concerned about recurrent UTI.  She apparently completed a course of nitrofurantoin last week.  Symptoms briefly improved but then recurred.  Patient is essentially nonverbal at  baseline.  She currently resides in assisted living.   Patient with apparent history of urinary retention in the past.  She is no longer self cathing as she has had to do in the past.  On exam the patient's bladder shows 400 mL on bladder scan.  Catheter placed.  Bladder decompressed.   UA is suggestive of recurrent UTI. Lactic Acid 2.7.  Obtained labs also demonstrate mild AKI.  IV fluids and antibiotics administered.  Patient's son is at bedside.  He agrees with plan to admit for IV fluids, IV antibiotics, continue to workup.  Hospitalist service is aware of case and will evaluate for admission.  Amount and/or Complexity of Data Reviewed Labs: ordered. Radiology: ordered.  Risk Decision regarding hospitalization.        Final diagnoses:  Weakness  Altered mental status, unspecified altered mental status type  Urinary tract infection with hematuria, site unspecified  Urinary retention    ED Discharge Orders     None          Laurice Maude BROCKS, MD 09/16/23 1342

## 2023-09-17 ENCOUNTER — Encounter (HOSPITAL_COMMUNITY): Payer: Self-pay | Admitting: Internal Medicine

## 2023-09-17 DIAGNOSIS — N3000 Acute cystitis without hematuria: Secondary | ICD-10-CM

## 2023-09-17 DIAGNOSIS — N390 Urinary tract infection, site not specified: Secondary | ICD-10-CM

## 2023-09-17 LAB — BASIC METABOLIC PANEL WITH GFR
Anion gap: 7 (ref 5–15)
BUN: 31 mg/dL — ABNORMAL HIGH (ref 8–23)
CO2: 20 mmol/L — ABNORMAL LOW (ref 22–32)
Calcium: 8.1 mg/dL — ABNORMAL LOW (ref 8.9–10.3)
Chloride: 112 mmol/L — ABNORMAL HIGH (ref 98–111)
Creatinine, Ser: 1 mg/dL (ref 0.44–1.00)
GFR, Estimated: 57 mL/min — ABNORMAL LOW (ref >60)
Glucose, Bld: 108 mg/dL — ABNORMAL HIGH (ref 70–99)
Potassium: 3.7 mmol/L (ref 3.5–5.1)
Sodium: 139 mmol/L (ref 135–145)

## 2023-09-17 LAB — MAGNESIUM: Magnesium: 2 mg/dL (ref 1.7–2.4)

## 2023-09-17 LAB — PHOSPHORUS: Phosphorus: 2.8 mg/dL (ref 2.5–4.6)

## 2023-09-17 MED ORDER — NYSTATIN 100000 UNIT/GM EX POWD
1.0000 | Freq: Two times a day (BID) | CUTANEOUS | Status: DC | PRN
  Filled 2023-09-17: qty 15

## 2023-09-17 MED ORDER — BISMUTH SUBSALICYLATE 262 MG PO CHEW
262.0000 mg | CHEWABLE_TABLET | Freq: Four times a day (QID) | ORAL | Status: DC | PRN
  Filled 2023-09-17: qty 1

## 2023-09-17 MED ORDER — ENOXAPARIN SODIUM 40 MG/0.4ML IJ SOSY
40.0000 mg | PREFILLED_SYRINGE | INTRAMUSCULAR | Status: DC
  Filled 2023-09-17: qty 0.4

## 2023-09-17 MED ORDER — HYDRALAZINE HCL 20 MG/ML IJ SOLN
10.0000 mg | Freq: Four times a day (QID) | INTRAMUSCULAR | Status: DC | PRN
  Administered 2023-09-18 – 2023-09-19 (×2): 10 mg via INTRAVENOUS
  Filled 2023-09-17 (×2): qty 1

## 2023-09-17 MED ORDER — CHLORHEXIDINE GLUCONATE CLOTH 2 % EX PADS
6.0000 | MEDICATED_PAD | Freq: Every day | CUTANEOUS | Status: DC
  Administered 2023-09-17 – 2023-09-18 (×2): 6 via TOPICAL

## 2023-09-17 NOTE — Plan of Care (Signed)

## 2023-09-17 NOTE — Evaluation (Signed)
 Physical Therapy Evaluation Patient Details Name: [name] MRN: [id number] DOB: 05-Sep-1940 Today's Date: 09/17/2023  History of Present Illness  Patient is an 83 y/o female admitted 09/16/23 with weakness and emesis found to have UTI.  PMH significant for OA, Alzheimer's dementia, HTN, fibromyalgia, GAD, interstitial cystitis with recurrent UTI's, h/o total cystectomy with ileal conduit, IBS, uterine neoplasm, SVT.  Clinical Impression  Patient presents with mobility close to functional baseline.  Grandson reports she walks the unit at memory care.  Noted initial imbalance though progressed to supervision level during mobility.  Did have to redirect to avoid entering empty rooms on hallway.  RN aware nursing staff can assist for hallway ambulation.  No further skilled PT needs. Will sign off.         If plan is discharge home, recommend the following: A little help with walking and/or transfers;A lot of help with bathing/dressing/bathroom   Can travel by private vehicle        Equipment Recommendations None recommended by PT  Recommendations for Other Services       Functional Status Assessment Patient has not had a recent decline in their functional status     Precautions / Restrictions Precautions Precautions: Fall Recall of Precautions/Restrictions: Impaired      Mobility  Bed Mobility Overal bed mobility: Modified Independent                  Transfers Overall transfer level: Needs assistance   Transfers: Sit to/from Stand Sit to Stand: Supervision, Contact guard assist           General transfer comment: increased time, mildly unsteady initially standing    Ambulation/Gait Ambulation/Gait assistance: Contact guard assist, Supervision Gait Distance (Feet): 600 Feet Assistive device: None (versus wall rail use) Gait Pattern/deviations: Step-through pattern, Decreased stride length, Shuffle          Stairs            Wheelchair Mobility      Tilt Bed    Modified Rankin (Stroke Patients Only)       Balance Overall balance assessment: Needs assistance   Sitting balance-Leahy Scale: Good       Standing balance-Leahy Scale: Good Standing balance comment: static balance good, dynamic with some deficits though self recovers and improves with increased mobility, CGA initially progressed to S level                             Pertinent Vitals/Pain Pain Assessment Pain Assessment: Faces Faces Pain Scale: No hurt    Home Living Family/patient expects to be discharged to:: Assisted living                   Additional Comments: memory care unit    Prior Function Prior Level of Function : Needs assist             Mobility Comments: walks the unit at memory care ADLs Comments: feeds herself, help for toileting, bathing, dressing per grandson     Extremity/Trunk Assessment   Upper Extremity Assessment Upper Extremity Assessment: Overall WFL for tasks assessed    Lower Extremity Assessment Lower Extremity Assessment: Overall WFL for tasks assessed    Cervical / Trunk Assessment Cervical / Trunk Assessment: Normal  Communication   Communication Communication: Impaired Factors Affecting Communication: Difficulty expressing self (non verbal, does answer with verbal utterance when questioned if she is ok)    Cognition Arousal: Alert Behavior  During Therapy: Impulsive   PT - Cognitive impairments: History of cognitive impairments                       PT - Cognition Comments: h/o dementia, at memory care, per grandson she enjoys walking the unit Following commands: Impaired Following commands impaired: Follows one step commands inconsistently, Follows one step commands with increased time     Cueing Cueing Techniques: Verbal cues, Gestural cues, Visual cues     General Comments General comments (skin integrity, edema, etc.): Grandson in the room providing history,  patient with some gas during ambulation and RN aware (reports pt does not indicate when she needs to have bowel movements)    Exercises     Assessment/Plan    PT Assessment Patient does not need any further PT services  PT Problem List         PT Treatment Interventions      PT Goals (Current goals can be found in the Care Plan section)  Acute Rehab PT Goals PT Goal Formulation: All assessment and education complete, DC therapy    Frequency       Co-evaluation               AM-PAC PT 6 Clicks Mobility  Outcome Measure Help needed turning from your back to your side while in a flat bed without using bedrails?: None Help needed moving from lying on your back to sitting on the side of a flat bed without using bedrails?: None Help needed moving to and from a bed to a chair (including a wheelchair)?: None Help needed standing up from a chair using your arms (e.g., wheelchair or bedside chair)?: None Help needed to walk in hospital room?: A Little Help needed climbing 3-5 steps with a railing? : A Little 6 Click Score: 22    End of Session Equipment Utilized During Treatment: Gait belt Activity Tolerance: Patient tolerated treatment well Patient left: in bed;with family/visitor present;with call bell/phone within reach;with bed alarm set   PT Visit Diagnosis: Unsteadiness on feet (R26.81)    Time: 8371-8347 PT Time Calculation (min) (ACUTE ONLY): 24 min   Charges:   PT Evaluation $PT Eval Low Complexity: 1 Low PT Treatments $Gait Training: 8-22 mins PT General Charges $$ ACUTE PT VISIT: 1 Visit         Micheline Portal, PT Acute Rehabilitation Services [phone] 09/17/2023   Montie Portal 09/17/2023, 5:16 PM

## 2023-09-17 NOTE — Progress Notes (Signed)
 PROGRESS NOTE  [name] FMW:[id number] DOB: 1940/06/30 DOA: 09/16/2023 PCP: Vernon Velna SAUNDERS, MD   LOS: 1 day   Brief narrative:   [name] is a 83 y.o. female with past medical history of dementia, essential hypertension, fibromyalgia, generalized anxiety disorder, interstitial cystitis, history of recurrent UTIs, history of total cystectomy, ileal conduit, IBS, mixed hyperlipidemia, colon polyps,, supraventricular tachycardia was brought from the memory care unit with complaints of generalized weakness and episode of emesis.  Of note patient was recently treated for UTI as outpatient.  In the ED, patient was noted to have slightly elevated blood pressure.  Labs were notable for WBC elevated at 15.0.  Corrected sodium was 134 and potassium 5.5.  Patient had elevated creatinine at 1.3. Urinalysis was red with cloudy appearance, pH of 9.0, no other hemoglobin, greater than 300 protein, large leukocyte esterase.  Greater than 50 RBC, greater than 50 WBC and many bacteria.   CT head without contrast with no acute intracranial abnormality.  The patient received ceftriaxone  1 g IVPB and 1000 mL normal saline bolus and was considered for admission to the hospital for further evaluation and treatment..     Assessment/Plan: Principal Problem:   UTI (urinary tract infection) Active Problems:   Dementia due to Alzheimer's disease   Essential hypertension   Generalized anxiety disorder   Mixed hyperlipidemia   Supraventricular tachycardia   UTI  Patient had leukocytosis with elevated lactate on presentation, continue IV fluids.  Continue Rocephin .  Follow urine culture and blood cultures.  Temperature maximum of 99 F.     Dementia due to Alzheimer's disease   Generalized anxiety disorder On memory care unit.  Continue trazodone  citalopram  Klonopin     Essential hypertension Continue metoprolol      Mixed hyperlipidemia Follow-up with primary care provider.     Supraventricular  tachycardia On metoprolol  as above.  DVT prophylaxis: enoxaparin  (LOVENOX ) injection 40 mg Start: 09/17/23 2200   Disposition: Memory care unit in 1 to 2 days will get PT evaluation today  Status is: Inpatient Remains inpatient appropriate because: Pending clinical improvement, IV antibiotic    Code Status:     Code Status: Full Code  Family Communication: Spoke with the patient's son at bedside  Consultants: None  Procedures: None  Anti-infectives:  Rocephin  IV  Anti-infectives (From admission, onward)    Start     Dose/Rate Route Frequency Ordered Stop   09/17/23 1500  cefTRIAXone  (ROCEPHIN ) 1 g in sodium chloride  0.9 % 100 mL IVPB        1 g 200 mL/hr over 30 Minutes Intravenous Every 24 hours 09/16/23 1555     09/16/23 1345  cefTRIAXone  (ROCEPHIN ) 1 g in sodium chloride  0.9 % 100 mL IVPB        1 g 200 mL/hr over 30 Minutes Intravenous  Once 09/16/23 1331 09/16/23 1525        Subjective: Today, patient was seen and examined at bedside.  Patient is minimally interactive.  As per the son at bedside she slept well.  No mention of fever chills nausea vomiting shortness of breath or dyspnea with  Objective: Vitals:   09/17/23 0017 09/17/23 0309  BP: (!) 134/46 (!) 156/48  Pulse: 90 96  Resp:    Temp: 99 F (37.2 C) 98.4 F (36.9 C)  SpO2: 95% 95%    Intake/Output Summary (Last 24 hours) at 09/17/2023 0842 Last data filed at 09/17/2023 0327 Gross per 24 hour  Intake 1000 ml  Output 2550  ml  Net -1550 ml   Filed Weights   09/16/23 1008  Weight: 60.6 kg   Body mass index is 22.23 kg/m.   Physical Exam: GENERAL: Patient is alert awake has advanced dementia and is minimally interactive.. Not in obvious distress.  Elderly female. HENT: No scleral pallor or icterus. Pupils equally reactive to light. Oral mucosa is moist NECK: is supple, no gross swelling noted. CHEST: Clear to auscultation. No crackles or wheezes.   CVS: S1 and S2 heard, no murmur.  Regular rate and rhythm.  ABDOMEN: Soft, non-tender, bowel sounds are present. EXTREMITIES: No edema. CNS: Cranial nerves are intact. No focal motor deficits. SKIN: warm and dry without rashes.  Data Review: I have personally reviewed the following laboratory data and studies,  CBC: Recent Labs  Lab 09/16/23 1230  WBC 15.0*  NEUTROABS 13.3*  HGB 13.9  HCT 44.6  MCV 88.8  PLT 380   Basic Metabolic Panel: Recent Labs  Lab 09/16/23 1230 09/17/23 0546  NA 133* 139  K 5.5* 3.7  CL 100 112*  CO2 21* 20*  GLUCOSE 162* 108*  BUN 10 31*  CREATININE 1.37* 1.00  CALCIUM 9.3 8.1*  MG  --  2.0  PHOS  --  2.8   Liver Function Tests: Recent Labs  Lab 09/16/23 1230  AST 24  ALT 16  ALKPHOS 104  BILITOT 0.9  PROT 7.2  ALBUMIN 3.7   No results for input(s): LIPASE, AMYLASE in the last 168 hours. No results for input(s): AMMONIA in the last 168 hours. Cardiac Enzymes: No results for input(s): CKTOTAL, CKMB, CKMBINDEX, TROPONINI in the last 168 hours. BNP (last 3 results) No results for input(s): BNP in the last 8760 hours.  ProBNP (last 3 results) Recent Labs    07/13/23 0820  PROBNP 426.0*    CBG: No results for input(s): GLUCAP in the last 168 hours. No results found for this or any previous visit (from the past 240 hours).   Studies: CT Head Wo Contrast Result Date: 09/16/2023 EXAM: CT HEAD WITHOUT CONTRAST 09/16/2023 10:31:00 AM TECHNIQUE: CT of the head was performed without the administration of intravenous contrast. Automated exposure control, iterative reconstruction, and/or weight based adjustment of the mA/kV was utilized to reduce the radiation dose to as low as reasonably achievable. COMPARISON: MRI of the head dated 12/13/2000. CLINICAL HISTORY: Mental status change, unknown cause. Recent UTI. Last antibiotic given Thursday. Did well over weekend. Declining started again Monday. Pt in memory care. Mostly non-verbal at baseline. FINDINGS:  BRAIN AND VENTRICLES: No acute hemorrhage. Gray-white differentiation is preserved. No hydrocephalus. No extra-axial collection. No mass effect or midline shift. Age-related atrophy and mild periventricular white matter disease. ORBITS: The patient is status post bilateral cataract surgery. SINUSES: No acute abnormality. SOFT TISSUES AND SKULL: No acute soft tissue abnormality. No skull fracture. IMPRESSION: 1. No acute intracranial abnormality. 2. Age-related atrophy and mild periventricular white matter disease. Electronically signed by: Evalene Coho MD 09/16/2023 10:44 AM EDT RP Workstation: [id number]      Vernal Alstrom, MD  [facility] 09/17/2023  If 7PM-7AM, please contact night-coverage

## 2023-09-17 NOTE — TOC Initial Note (Signed)
 Transition of Care [facility]) - Initial/Assessment Note    Patient Details  Name: [name] MRN: [id number] Date of Birth: [birth year]  [facility] CM/SW Contact:    Sonda Manuella Quill, RN Phone Number: 09/17/2023, 1:46 PM  Clinical Narrative:                 TOC for d/c planning; also notified pt's son Adina Ernst [phone]) would like to discuss pt having HHRN for assistance w/ self-cath; pt non-verbal; spoke w/ her son Selinda in room; he says family plans on pt returning to [facility] at d/c; he said family will provide transportation; pt does not have DME or home oxygen; her son said pt has aide but he is not sure what agency provides service; he would like for his brother to be contacted; also pt does not have home oxygen; spoke w/ Warren, [place] [phone]) at facility; she confirmed residency and level of care; Warren also said pt can return at d/c, and FL2, D/C summary, and hard RX needed for meds.  -1202- spoke w/ pt's son Adina; he confirms family plans on pt d/c to [facility]; he said her aide is provided through [employer]; Mr Stefanski said he has obtained list of home health agencies from [facility], and he does not have preference; explained PT eval pending; HH services not yet ordered; awaiting PT eval; will pass on to coming [facility] for follow up.  Expected Discharge Plan: Skilled Nursing Facility Barriers to Discharge: Continued Medical Work up   Patient Goals and CMS Choice Patient states their goals for this hospitalization and ongoing recovery are:: pt's spns say she will return to [facility].gov Compare Post Acute Care list provided to:: Patient Represenative (must comment) Rubie Ernst (son))   [place] ownership interest in [facility].provided to:: Adult Children    Expected Discharge Plan and Services   Discharge Planning Services: CM Consult   Living arrangements for the past 2 months: Skilled Nursing  Facility                                      Prior Living Arrangements/Services Living arrangements for the past 2 months: Skilled Nursing Facility Lives with:: Facility Resident Patient language and need for interpreter reviewed:: Yes Do you feel safe going back to the place where you live?: Yes      Need for Family Participation in Patient Care: Yes (Comment) Care giver support system in place?: Yes (comment) Current home services: Homehealth aide (HH Aide from [facility]) Criminal Activity/Legal Involvement Pertinent to Current Situation/Hospitalization: No - Comment as needed  Activities of Daily Living   ADL Screening (condition at time of admission) Independently performs ADLs?: No Does the patient have a NEW difficulty with bathing/dressing/toileting/self-feeding that is expected to last >3 days?: No Does the patient have a NEW difficulty with getting in/out of bed, walking, or climbing stairs that is expected to last >3 days?: No Does the patient have a NEW difficulty with communication that is expected to last >3 days?: No Is the patient deaf or have difficulty hearing?: No Does the patient have difficulty seeing, even when wearing glasses/contacts?: No Does the patient have difficulty concentrating, remembering, or making decisions?: Yes  Permission Sought/Granted Permission sought to share information with : Case Manager Permission granted to share information with : Yes, Verbal Permission Granted  Share  Information with NAME: Case Manager     Permission granted to share info w Relationship: Adina Ernst (son) [phone]     Emotional Assessment Appearance:: Appears stated age Attitude/Demeanor/Rapport: Unable to Assess Affect (typically observed): Unable to Assess Orientation: :  (unable to assess) Alcohol / Substance Use: Not Applicable Psych Involvement: No (comment)  Admission diagnosis:  Urinary retention [R33.9] Weakness [R53.1] Sepsis  secondary to UTI (HCC) [A41.9, N39.0] Urinary tract infection with hematuria, site unspecified [N39.0, R31.9] Altered mental status, unspecified altered mental status type [R41.82] Patient Active Problem List   Diagnosis Date Noted   UTI (urinary tract infection) 09/17/2023   Allergic rhinitis    Essential hypertension    Generalized anxiety disorder    Hypercholesterolemia    Mixed hyperlipidemia    Insomnia    Palpitations    Supraventricular tachycardia    Dementia due to Alzheimer's disease 11/08/2020   History of urinary diversion procedure 07/21/2019   Pain in left knee 11/11/2017   Arthritis of right knee 02/02/2017   Pain in right knee 02/02/2017   Abdominal pain 03/17/2016   Poor appetite 03/17/2016   History of total cystectomy 05/19/2012   History of recurrent UTIs 02/26/2011   Irritable bowel syndrome 09/17/2007   Interstitial cystitis 09/17/2007   Fibromyalgia 09/17/2007   Personal history of malignant neoplasm of other parts of uterus 09/17/2007   History of colonic polyps 09/17/2007   PCP:  Vernon Velna SAUNDERS, MD Pharmacy:   [id number] [place], [place] - [address] [place] [address] Phone: [phone] Fax: [phone]  [facility] [address] [place] [address] Phone: [phone] Fax: [phone]     Social Drivers of Health (SDOH) Social History: SDOH Screenings   Food Insecurity: No Food Insecurity (09/17/2023)  Housing: Low Risk  (09/17/2023)  Transportation Needs: No Transportation Needs (09/17/2023)  Utilities: Not At Risk (09/17/2023)  Social Connections: Patient Unable To Answer (09/16/2023)  Tobacco Use: Low Risk  (09/16/2023)   SDOH Interventions: Food Insecurity Interventions: Intervention Not Indicated, Inpatient TOC Housing Interventions: Intervention Not Indicated, Inpatient TOC Transportation Interventions: Intervention Not Indicated, Inpatient  TOC Utilities Interventions: Intervention Not Indicated, Inpatient TOC   Readmission Risk Interventions     No data to display

## 2023-09-18 DIAGNOSIS — N3000 Acute cystitis without hematuria: Secondary | ICD-10-CM | POA: Diagnosis not present

## 2023-09-18 LAB — BASIC METABOLIC PANEL WITH GFR
Anion gap: 6 (ref 5–15)
BUN: 26 mg/dL — ABNORMAL HIGH (ref 8–23)
CO2: 19 mmol/L — ABNORMAL LOW (ref 22–32)
Calcium: 8 mg/dL — ABNORMAL LOW (ref 8.9–10.3)
Chloride: 109 mmol/L (ref 98–111)
Creatinine, Ser: 0.91 mg/dL (ref 0.44–1.00)
GFR, Estimated: >60 mL/min (ref >60)
Glucose, Bld: 97 mg/dL (ref 70–99)
Potassium: 3.3 mmol/L — ABNORMAL LOW (ref 3.5–5.1)
Sodium: 134 mmol/L — ABNORMAL LOW (ref 135–145)

## 2023-09-18 LAB — CBC
HCT: 35.2 % — ABNORMAL LOW (ref 36.0–46.0)
Hemoglobin: 10.9 g/dL — ABNORMAL LOW (ref 12.0–15.0)
MCH: 28.3 pg (ref 26.0–34.0)
MCHC: 31 g/dL (ref 30.0–36.0)
MCV: 91.4 fL (ref 80.0–100.0)
Platelets: 284 K/uL (ref 150–400)
RBC: 3.85 MIL/uL — ABNORMAL LOW (ref 3.87–5.11)
RDW: 13.3 % (ref 11.5–15.5)
WBC: 7.8 K/uL (ref 4.0–10.5)
nRBC: 0 % (ref 0.0–0.2)

## 2023-09-18 LAB — MAGNESIUM: Magnesium: 1.9 mg/dL (ref 1.7–2.4)

## 2023-09-18 MED ORDER — POTASSIUM CHLORIDE CRYS ER 20 MEQ PO TBCR
40.0000 meq | EXTENDED_RELEASE_TABLET | Freq: Once | ORAL | Status: AC
  Administered 2023-09-18: 40 meq via ORAL
  Filled 2023-09-18: qty 2

## 2023-09-18 NOTE — Plan of Care (Signed)

## 2023-09-18 NOTE — TOC Progression Note (Addendum)
 Transition of Care [facility]) - Progression Note    Patient Details  Name: [name] MRN: [id number] Date of Birth: Jan 15, 1941  Transition of Care [facility]) CM/SW Contact  NORMAN ASPEN, [place] Phone Number: 09/18/2023, 2:43 PM  Clinical Narrative:     Have spoken with pt's son, Matt today along with MD and Librarian, academic at [employer] [facility]).  All are anticipating pt will be ready to return to memory care at [facility] tomorrow and Ms. Woolard confirms that they can accommodate a Saturday return.  She asks that [facility] coverage call facility @ [phone] and speak with either Warren Public or Laymon Borrow to confirm dc.  TOC will need to fax FL2 and dc summary to [phone] as well for review prior to discharge.  Order have been placed and accepted by [facility] for [facility] to follow for cathing needs.  Son will provide discharge transportation.  TOC will continue to follow.  Expected Discharge Plan: Skilled Nursing Facility Barriers to Discharge: Continued Medical Work up               Expected Discharge Plan and Services   Discharge Planning Services: CM Consult   Living arrangements for the past 2 months: Skilled Nursing Facility                                       Social Drivers of Health (SDOH) Interventions SDOH Screenings   Food Insecurity: No Food Insecurity (09/17/2023)  Housing: Low Risk  (09/17/2023)  Transportation Needs: No Transportation Needs (09/17/2023)  Utilities: Not At Risk (09/17/2023)  Social Connections: Patient Unable To Answer (09/16/2023)  Tobacco Use: Low Risk  (09/16/2023)    Readmission Risk Interventions     No data to display

## 2023-09-18 NOTE — Progress Notes (Addendum)
 PROGRESS NOTE  [name] FMW:[id number] DOB: [birth year] DOA: 09/16/2023 PCP: Vernon Velna SAUNDERS, MD   LOS: 2 days   Brief narrative:   [name] is a 83 y.o. female with past medical history of dementia, essential hypertension, fibromyalgia, generalized anxiety disorder, interstitial cystitis, history of recurrent UTIs, history of total cystectomy, ileal conduit, IBS, mixed hyperlipidemia, colon polyps,, supraventricular tachycardia was brought from the memory care unit with complaints of generalized weakness and episode of emesis.  Of note patient was recently treated for UTI as outpatient.  In the ED, patient was noted to have slightly elevated blood pressure.  Labs were notable for WBC elevated at 15.0.  Corrected sodium was 134 and potassium 5.5.  Patient had elevated creatinine at 1.3. Urinalysis was red with cloudy appearance, pH of 9.0, no other hemoglobin, greater than 300 protein, large leukocyte esterase.  Greater than 50 RBC, greater than 50 WBC and many bacteria.   CT head without contrast with no acute intracranial abnormality.  The patient received ceftriaxone  1 g IVPB and 1000 mL normal saline bolus and was considered for admission to the hospital for further evaluation and treatment..     Assessment/Plan: Principal Problem:   UTI (urinary tract infection) Active Problems:   Dementia due to Alzheimer's disease   Essential hypertension   Generalized anxiety disorder   Mixed hyperlipidemia   Supraventricular tachycardia  Severe sepsis secondary to gram-negative UTI  Patient had leukocytosis with elevated lactate, mild tachycardia on presentation suggestive of sepsis with organ dysfunction., continue IV fluids.  Continue Rocephin .  Blood cultures negative in 2 days.  Urine culture showing gram-negative rods.  Will follow sensitivity.  Temperature maximum of 99.4 F.  History of urinary retention and intermittent self cath.  Used to do it at home but since she is in the  memory care unit has not been doing it.  Follows up with urologist as outpatient.  At this time patient's son wishes her to have intermittent cath every couple of days or so.  Communicated with TOC regarding this.  Currently on a Foley catheter.  Will need to address this prior to discharge.     Dementia due to Alzheimer's disease   Generalized anxiety disorder On memory care unit.  Continue trazodone , citalopram  Klonopin .  Patient is a little more alert today.    Essential hypertension Continue metoprolol   Hypokalemia.  Potassium of 3.3.  Will replace orally.  Check levels in AM.     Mixed hyperlipidemia Follow-up with primary care provider.     Supraventricular tachycardia On metoprolol .  Rate controlled.  DVT prophylaxis: enoxaparin  (LOVENOX ) injection 40 mg Start: 09/17/23 2200 does not be safe.  Will put a SCD.   Disposition: Memory care unit in 1 to 2 days seen by PT and no further recommendations.  Status is: Inpatient Remains inpatient appropriate because: Pending clinical improvement, IV antibiotic, awaiting cultures    Code Status:     Code Status: Full Code  Family Communication: Spoke with the patient's son at bedside again today.  Consultants: None  Procedures: None  Anti-infectives:  Rocephin  IV  Anti-infectives (From admission, onward)    Start     Dose/Rate Route Frequency Ordered Stop   09/17/23 1500  cefTRIAXone  (ROCEPHIN ) 1 g in sodium chloride  0.9 % 100 mL IVPB        1 g 200 mL/hr over 30 Minutes Intravenous Every 24 hours 09/16/23 1555     09/16/23 1345  cefTRIAXone  (ROCEPHIN ) 1 g in sodium  chloride 0.9 % 100 mL IVPB        1 g 200 mL/hr over 30 Minutes Intravenous  Once 09/16/23 1331 09/16/23 1525        Subjective: Today, patient was seen and examined at bedside.  Patient patient sitting up in the chair.  More interactive today than yesterday.  No fever or chills nausea vomiting.  Patient's son at bedside concerned about need for  catheterization.  Currently on a Foley catheter.  Social worker at bedside as well.  Discussed about disposition.  Objective: Vitals:   09/17/23 2016 09/18/23 0407  BP: (!) 142/39 (!) 128/49  Pulse: 85 70  Resp: 18 18  Temp: 98.8 F (37.1 C) 98.5 F (36.9 C)  SpO2: 98% 96%    Intake/Output Summary (Last 24 hours) at 09/18/2023 1035 Last data filed at 09/17/2023 2230 Gross per 24 hour  Intake 680 ml  Output 450 ml  Net 230 ml   Filed Weights   09/16/23 1008  Weight: 60.6 kg   Body mass index is 22.23 kg/m.   Physical Exam: GENERAL: Patient is alert awake has advanced dementia, more interactive today. Not in obvious distress.  Elderly female. HENT: No scleral pallor or icterus. Pupils equally reactive to light. Oral mucosa is moist NECK: is supple, no gross swelling noted. CHEST: Clear to auscultation. No crackles or wheezes.   CVS: S1 and S2 heard, no murmur. Regular rate and rhythm.  ABDOMEN: Soft, non-tender, bowel sounds are present.  Foley catheter in place. EXTREMITIES: No edema. CNS: Cranial nerves are intact. No focal motor deficits. SKIN: warm and dry without rashes.  Data Review: I have personally reviewed the following laboratory data and studies,  CBC: Recent Labs  Lab 09/16/23 1230 09/18/23 0543  WBC 15.0* 7.8  NEUTROABS 13.3*  --   HGB 13.9 10.9*  HCT 44.6 35.2*  MCV 88.8 91.4  PLT 380 284   Basic Metabolic Panel: Recent Labs  Lab 09/16/23 1230 09/17/23 0546 09/18/23 0543  NA 133* 139 134*  K 5.5* 3.7 3.3*  CL 100 112* 109  CO2 21* 20* 19*  GLUCOSE 162* 108* 97  BUN 10 31* 26*  CREATININE 1.37* 1.00 0.91  CALCIUM 9.3 8.1* 8.0*  MG  --  2.0 1.9  PHOS  --  2.8  --    Liver Function Tests: Recent Labs  Lab 09/16/23 1230  AST 24  ALT 16  ALKPHOS 104  BILITOT 0.9  PROT 7.2  ALBUMIN 3.7   No results for input(s): LIPASE, AMYLASE in the last 168 hours. No results for input(s): AMMONIA in the last 168 hours. Cardiac  Enzymes: No results for input(s): CKTOTAL, CKMB, CKMBINDEX, TROPONINI in the last 168 hours. BNP (last 3 results) No results for input(s): BNP in the last 8760 hours.  ProBNP (last 3 results) Recent Labs    07/13/23 0820  PROBNP 426.0*    CBG: No results for input(s): GLUCAP in the last 168 hours. Recent Results (from the past 240 hours)  Urine Culture     Status: Abnormal (Preliminary result)   Collection Time: 09/16/23 10:10 AM   Specimen: Urine, Random  Result Value Ref Range Status   Specimen Description   Final    URINE, RANDOM Performed at [facility], [address] [place], [place] [address]    Special Requests   Final    NONE Reflexed from [id number] Performed at [facility], [address] [place], [place] [address]  Culture (A)  Final    >=100,000 COLONIES/mL GRAM NEGATIVE RODS SUSCEPTIBILITIES TO FOLLOW Performed at [facility], [address] [place], [place] [address]    Report Status PENDING  Incomplete  Culture, blood (routine x 2)     Status: None (Preliminary result)   Collection Time: 09/16/23 12:10 PM   Specimen: BLOOD  Result Value Ref Range Status   Specimen Description   Final    BLOOD BLOOD LEFT FOREARM Performed at [facility], [address] [place], [place] [address]    Special Requests   Final    BOTTLES DRAWN AEROBIC AND ANAEROBIC Blood Culture results may not be optimal due to an inadequate volume of blood received in culture bottles Performed at [facility], [address], [place], [place] [address]    Culture   Final    NO GROWTH 2 DAYS Performed at [facility], [address] [place], [place] [address]    Report Status PENDING  Incomplete  Culture, blood (routine x 2)     Status: None (Preliminary result)   Collection Time: 09/16/23 12:15 PM   Specimen: BLOOD  Result Value Ref Range Status   Specimen Description   Final     BLOOD LEFT ANTECUBITAL Performed at [facility], [address], [place], [place] [address]    Special Requests   Final    BOTTLES DRAWN AEROBIC AND ANAEROBIC Blood Culture results may not be optimal due to an inadequate volume of blood received in culture bottles Performed at [facility], [address] [place], [place] [address]    Culture   Final    NO GROWTH 2 DAYS Performed at [facility], [address], [place], [place] [address]    Report Status PENDING  Incomplete     Studies: No results found.     Vernal Alstrom, MD  [facility] 09/18/2023  If 7PM-7AM, please contact night-coverage

## 2023-09-19 DIAGNOSIS — N3000 Acute cystitis without hematuria: Secondary | ICD-10-CM | POA: Diagnosis not present

## 2023-09-19 LAB — CBC
HCT: 37.9 % (ref 36.0–46.0)
Hemoglobin: 12.1 g/dL (ref 12.0–15.0)
MCH: 28.3 pg (ref 26.0–34.0)
MCHC: 31.9 g/dL (ref 30.0–36.0)
MCV: 88.6 fL (ref 80.0–100.0)
Platelets: 343 K/uL (ref 150–400)
RBC: 4.28 MIL/uL (ref 3.87–5.11)
RDW: 13.1 % (ref 11.5–15.5)
WBC: 12.8 K/uL — ABNORMAL HIGH (ref 4.0–10.5)
nRBC: 0 % (ref 0.0–0.2)

## 2023-09-19 LAB — BASIC METABOLIC PANEL WITH GFR
Anion gap: 12 (ref 5–15)
BUN: 35 mg/dL — ABNORMAL HIGH (ref 8–23)
CO2: 15 mmol/L — ABNORMAL LOW (ref 22–32)
Calcium: 8.8 mg/dL — ABNORMAL LOW (ref 8.9–10.3)
Chloride: 105 mmol/L (ref 98–111)
Creatinine, Ser: 1.58 mg/dL — ABNORMAL HIGH (ref 0.44–1.00)
GFR, Estimated: 33 mL/min — ABNORMAL LOW (ref >60)
Glucose, Bld: 143 mg/dL — ABNORMAL HIGH (ref 70–99)
Potassium: 4 mmol/L (ref 3.5–5.1)
Sodium: 132 mmol/L — ABNORMAL LOW (ref 135–145)

## 2023-09-19 LAB — URINE CULTURE: Culture: >=100000 — AB

## 2023-09-19 LAB — MAGNESIUM: Magnesium: 2 mg/dL (ref 1.7–2.4)

## 2023-09-19 MED ORDER — CEFADROXIL 500 MG PO CAPS: 1000.0000 mg | ORAL_CAPSULE | Freq: Two times a day (BID) | ORAL | 0 refills | Status: AC

## 2023-09-19 MED ORDER — CEFADROXIL 500 MG PO CAPS: 1000.0000 mg | ORAL_CAPSULE | Freq: Two times a day (BID) | ORAL | Status: DC

## 2023-09-19 MED ORDER — CEFADROXIL 500 MG PO CAPS
1000.0000 mg | ORAL_CAPSULE | Freq: Two times a day (BID) | ORAL | Status: DC
  Administered 2023-09-19: 1000 mg via ORAL
  Filled 2023-09-19: qty 2

## 2023-09-19 MED ORDER — SODIUM CHLORIDE 0.9 % IV SOLN: INTRAVENOUS | Status: DC

## 2023-09-19 MED ORDER — ONDANSETRON HCL 4 MG PO TABS: 4.0000 mg | ORAL_TABLET | Freq: Four times a day (QID) | ORAL | Status: DC | PRN

## 2023-09-19 MED ORDER — ONDANSETRON HCL 4 MG PO TABS: 4.0000 mg | ORAL_TABLET | Freq: Four times a day (QID) | ORAL | 0 refills | Status: AC | PRN

## 2023-09-19 NOTE — NC FL2 (Signed)
 [place]  MEDICAID [id number] LEVEL OF CARE FORM     IDENTIFICATION  Patient Name: [name] Birthdate: [birth year] Sex: female Admission Date (Current Location): 09/16/2023  [facility] and [place] Number:  Producer, television/film/video and Address:  [facility],  [address], [place] [address]      Provider Number: [id number]  Attending Physician Name and Address:  Sonjia Held, MD  Relative Name and Phone Number:  TIANA, SIVERTSON (Son)  [phone] [facility])    Current Level of Care: Hospital Recommended Level of Care: Assisted Living Facility Prior Approval Number:    Date Approved/Denied:   PASRR Number:    Discharge Plan:  (ALF)    Current Diagnoses: Patient Active Problem List   Diagnosis Date Noted   UTI (urinary tract infection) 09/17/2023   Allergic rhinitis    Essential hypertension    Generalized anxiety disorder    Hypercholesterolemia    Mixed hyperlipidemia    Insomnia    Palpitations    Supraventricular tachycardia    Dementia due to Alzheimer's disease 11/08/2020   History of urinary diversion procedure 07/21/2019   Pain in left knee 11/11/2017   Arthritis of right knee 02/02/2017   Pain in right knee 02/02/2017   Abdominal pain 03/17/2016   Poor appetite 03/17/2016   History of total cystectomy 05/19/2012   History of recurrent UTIs 02/26/2011   Irritable bowel syndrome 09/17/2007   Interstitial cystitis 09/17/2007   Fibromyalgia 09/17/2007   Personal history of malignant neoplasm of other parts of uterus 09/17/2007   History of colonic polyps 09/17/2007    Orientation RESPIRATION BLADDER Height & Weight     Self  Normal Incontinent Weight: 133 lb 9.6 oz (60.6 kg) Height:  5' 5 (165.1 cm)  BEHAVIORAL SYMPTOMS/MOOD NEUROLOGICAL BOWEL NUTRITION STATUS      Continent Diet (heart)  AMBULATORY STATUS COMMUNICATION OF NEEDS Skin   Independent Verbally Normal                       Personal Care Assistance Level of Assistance   Bathing, Feeding, Dressing Bathing Assistance: Limited assistance Feeding assistance: Independent Dressing Assistance: Limited assistance     Functional Limitations Info  Sight, Hearing, Speech Sight Info: Adequate Hearing Info: Adequate Speech Info: Adequate    SPECIAL CARE FACTORS FREQUENCY                       Contractures Contractures Info: Not present    Additional Factors Info  Code Status, Allergies Code Status Info: full Allergies Info: Amoxicillin  Amoxicillin-pot Clavulanate  Cefuroxime  Influenza Vac Split Quad  Influenza Virus Vaccine  Levofloxacin   Paroxetine Hcl  Pneumococcal 13-val Conj Vacc  Quinolones  Sulfa Antibiotics  Topiramate  Moxifloxacin           Current Medications (09/19/2023):  This is the current hospital active medication list Current Facility-Administered Medications  Medication Dose Route Frequency Provider Last Rate Last Admin   0.9 %  sodium chloride  infusion   Intravenous Continuous Pokhrel, Laxman, MD 50 mL/hr at 09/19/23 1014 New Bag at 09/19/23 1014   acetaminophen  (TYLENOL ) tablet 650 mg  650 mg Oral Q6H PRN Celinda Alm Lot, MD   650 mg at 09/18/23 2116   Or   acetaminophen  (TYLENOL ) suppository 650 mg  650 mg Rectal Q6H PRN Celinda Alm Lot, MD       bismuth  subsalicylate (PEPTO BISMOL) chewable tablet 262 mg  262 mg Oral QID PRN Pokhrel,  Laxman, MD       cefadroxil  (DURICEF) capsule 1,000 mg  1,000 mg Oral BID Pokhrel, Laxman, MD   1,000 mg at 09/19/23 0957   Chlorhexidine  Gluconate Cloth 2 % PADS 6 each  6 each Topical Daily Pokhrel, Laxman, MD   6 each at 09/18/23 9077   citalopram  (CELEXA ) tablet 10 mg  10 mg Oral Daily Celinda Alm Lot, MD   10 mg at 09/19/23 1016   clonazePAM  (KLONOPIN ) tablet 0.5 mg  0.5 mg Oral QHS Celinda Alm Lot, MD   0.5 mg at 09/18/23 2116   hydrALAZINE  (APRESOLINE ) injection 10 mg  10 mg Intravenous Q6H PRN Pokhrel, Laxman, MD   10 mg at 09/19/23 9472   metoprolol  succinate (TOPROL -XL)  24 hr tablet 25 mg  25 mg Oral Daily Celinda Alm Lot, MD   25 mg at 09/19/23 1016   nystatin  (MYCOSTATIN /NYSTOP ) topical powder 1 Application  1 Application Topical BID PRN Pokhrel, Laxman, MD       ondansetron  (ZOFRAN ) tablet 4 mg  4 mg Oral Q6H PRN Celinda Alm Lot, MD       Or   ondansetron  (ZOFRAN ) injection 4 mg  4 mg Intravenous Q6H PRN Celinda Alm Lot, MD   4 mg at 09/19/23 9145   sodium zirconium cyclosilicate  (LOKELMA ) packet 10 g  10 g Oral Once Celinda Alm Lot, MD         Discharge Medications: STOP taking these medications     Geri-Tussin DM 10-100 MG/5ML liquid Generic drug: dextromethorphan-guaiFENesin    nitrofurantoin 50 MG capsule Commonly known as: MACRODANTIN           TAKE these medications     cefadroxil  500 MG capsule Commonly known as: DURICEF Take 2 capsules (1,000 mg total) by mouth 2 (two) times daily for 4 days.    citalopram  10 MG tablet Commonly known as: CELEXA  Take 10 mg by mouth daily.    clonazePAM  0.5 MG tablet Commonly known as: KLONOPIN  Take 0.5 mg by mouth at bedtime.    metoprolol  succinate 50 MG 24 hr tablet Commonly known as: TOPROL -XL Take 0.5 tablets (25 mg total) by mouth daily.    nystatin  powder Apply 1 Application topically 2 (two) times daily as needed (for red rashes- under the breasts).    ondansetron  4 MG tablet Commonly known as: ZOFRAN  Take 1 tablet (4 mg total) by mouth every 6 (six) hours as needed for nausea or vomiting.    Pepto Bismol 262 MG Caps Generic drug: Bismuth  Subsalicylate Take 262 mg by mouth 4 (four) times daily as needed (for constipation, heartburn indigestion, upset stomach, and/or diarrhea).    traZODone  100 MG tablet Commonly known as: DESYREL  Take 100 mg by mouth at bedtime.    triamcinolone  cream 0.1 % Commonly known as: KENALOG  Apply 1 Application topically 2 (two) times daily as needed (for itching- PERIANAL AREA).    TYLENOL  500 MG tablet Generic drug:  acetaminophen  Take 500 mg by mouth every 4 (four) hours as needed for mild pain (pain score 1-3) (or headaches, or a fever of 100 degrees F or greater).    Relevant Imaging Results:  Relevant Lab Results:   Additional Information [id number]  Tawni HERO Camdan Burdi, LCSW

## 2023-09-19 NOTE — Plan of Care (Signed)

## 2023-09-19 NOTE — TOC Transition Note (Signed)
 Transition of Care [facility]) - Discharge Note   Patient Details  Name: [name] MRN: [id number] Date of Birth: 1940-02-26  [facility] CM/SW Contact:  Tawni CHRISTELLA Eva, LCSW Phone Number: 09/19/2023, 3:02 PM   Clinical Narrative:    Csw spoke with [place] with [facility] , she is requesting FL2 and d/c summary for review before pt can d/c to facility.   3:25pm CSW spoke with [place] she stated pt is good to come back to facility. CSW spoke with pt's son who will provided transportation back to facility.  RN to call report to [phone]. IP care management sign off.    Final next level of care: Assisted Living (home health services) Barriers to Discharge: Barriers Resolved   Patient Goals and CMS Choice Patient states their goals for this hospitalization and ongoing recovery are:: pt's spns say she will return to [facility].gov Compare Post Acute Care list provided to:: Patient Represenative (must comment) Rubie Carlo (son))   [place] ownership interest in [facility].provided to:: Adult Children    Discharge Placement                    Patient and family notified of of transfer: 09/19/23  Discharge Plan and Services Additional resources added to the After Visit Summary for     Discharge Planning Services: CM Consult                                 Social Drivers of Health (SDOH) Interventions SDOH Screenings   Food Insecurity: No Food Insecurity (09/17/2023)  Housing: Low Risk  (09/17/2023)  Transportation Needs: No Transportation Needs (09/17/2023)  Utilities: Not At Risk (09/17/2023)  Social Connections: Patient Unable To Answer (09/16/2023)  Tobacco Use: Low Risk  (09/16/2023)     Readmission Risk Interventions     No data to display

## 2023-09-19 NOTE — Discharge Summary (Signed)
 Physician Discharge Summary  [name] FMW:[id number] DOB: Feb 20, 1940 DOA: 09/16/2023  PCP: Vernon Velna SAUNDERS, MD  Admit date: 09/16/2023 Discharge date: 09/19/2023  Admitted From: Memory care unit  Discharge disposition: Memory care unit   Recommendations for Outpatient Follow-Up:   Follow up with your primary care provider at the facility in 3 to 5 days. Check CBC, BMP, magnesium in the next visit Complete the course of antibiotic. Consider follow-up with urology as outpatient for bladder dysfunction.  Patient will need intermittent catheterization twice a week for bladder emptying.  Discharge Diagnosis:   Principal Problem:   UTI (urinary tract infection) Active Problems:   Dementia due to Alzheimer's disease   Essential hypertension   Generalized anxiety disorder   Mixed hyperlipidemia   Supraventricular tachycardia   Discharge Condition: Improved.  Diet recommendation: Low sodium, heart healthy.    Wound care: None.  Code status: Full.   History of Present Illness:    [name] is a 83 y.o. female with past medical history of dementia, essential hypertension, fibromyalgia, generalized anxiety disorder, interstitial cystitis, history of recurrent UTIs, history of total cystectomy, ileal conduit, IBS, mixed hyperlipidemia, colon polyps,, supraventricular tachycardia was brought from the memory care unit with complaints of generalized weakness and episode of emesis.  Of note patient was recently treated for UTI as outpatient.  In the ED, patient was noted to have slightly elevated blood pressure.  Labs were notable for WBC elevated at 15.0.  Corrected sodium was 134 and potassium 5.5.  Patient had elevated creatinine at 1.3. Urinalysis was red with cloudy appearance, pH of 9.0, no other hemoglobin, greater than 300 protein, large leukocyte esterase.  Greater than 50 RBC, greater than 50 WBC and many bacteria.   CT head without contrast with no acute intracranial  abnormality.  The patient received ceftriaxone  1 g IVPB and 1000 mL normal saline bolus and was considered for admission to the hospital for further evaluation and treatment.SABRA    Hospital Course:   Following conditions were addressed during hospitalization as listed below,  Severe sepsis secondary to E. coli UTI  Patient had leukocytosis with elevated lactate, mild tachycardia on presentation suggestive of sepsis with organ dysfunction.,  During hospitalization patient received IV Rocephin .  This will be changed to cefadroxil  on discharge depending upon sensitivity.  Blood cultures negative in 3 days.  Afebrile at this time.   History of urinary retention and intermittent self cath.   Used to do it at home but since she is in the memory care unit has not been doing it.  Follows up with urologist as outpatient.  Patient will be set up for intermittent catheterization after discharge.  Discontinue Foley catheter prior to discharge.    Dementia due to Alzheimer's disease   Generalized anxiety disorder On memory care unit.  Continue trazodone , citalopram  Klonopin .       Essential hypertension Continue metoprolol  on discharge.   Hypokalemia.  Improved after replacement.  Latest potassium of 4.0.     Mixed hyperlipidemia Follow-up with primary care provider.     Supraventricular tachycardia On metoprolol .  Rate controlled.  Disposition.  At this time, patient is stable for disposition back to memory care unit.  Will follow-up with PCP after discharge.  Spoke with the patient's son at bedside prior to disposition.  Medical Consultants:   None.  Procedures:    None Subjective:   Today, patient was seen and examined at bedside.  Had 1 episode of vomiting this morning.  Likely due to food intolerance.  Denies any pain or other symptoms.  Overall poor historian.  Discharge Exam:   Vitals:   09/19/23 0933 09/19/23 1255  BP: (!) 186/65 (!) 177/64  Pulse: 71 72  Resp:  18  Temp:   98.4 F (36.9 C)  SpO2: 96% 99%   Vitals:   09/18/23 2126 09/19/23 0440 09/19/23 0933 09/19/23 1255  BP: (!) 170/73 (!) 189/78 (!) 186/65 (!) 177/64  Pulse: 65 76 71 72  Resp: 18 18  18   Temp: 98.3 F (36.8 C) 98.2 F (36.8 C)  98.4 F (36.9 C)  TempSrc: Oral Oral    SpO2: 100% 96% 96% 99%  Weight:      Height:      Body mass index is 22.23 kg/m.   General: Alert awake, not in obvious distress, elderly female, minimally interactive HENT: pupils equally reacting to light,  No scleral pallor or icterus noted. Oral mucosa is moist.  Chest:  Clear breath sounds.  Diminished breath sounds bilaterally. No crackles or wheezes.  CVS: S1 &S2 heard. No murmur.  Regular rate and rhythm. Abdomen: Soft, nontender, nondistended.  Bowel sounds are heard.   Extremities: No cyanosis, clubbing or edema.  Peripheral pulses are palpable. Psych: Alert, awake and minimally interactive, flat affect, has significant cognitive dysfunction. CNS:  No cranial nerve deficits.  Moves all extremities. Skin: Warm and dry.  No rashes noted.  The results of significant diagnostics from this hospitalization (including imaging, microbiology, ancillary and laboratory) are listed below for reference.     Diagnostic Studies:   CT Head Wo Contrast Result Date: 09/16/2023 EXAM: CT HEAD WITHOUT CONTRAST 09/16/2023 10:31:00 AM TECHNIQUE: CT of the head was performed without the administration of intravenous contrast. Automated exposure control, iterative reconstruction, and/or weight based adjustment of the mA/kV was utilized to reduce the radiation dose to as low as reasonably achievable. COMPARISON: MRI of the head dated 12/13/2000. CLINICAL HISTORY: Mental status change, unknown cause. Recent UTI. Last antibiotic given Thursday. Did well over weekend. Declining started again Monday. Pt in memory care. Mostly non-verbal at baseline. FINDINGS: BRAIN AND VENTRICLES: No acute hemorrhage. Gray-white differentiation is  preserved. No hydrocephalus. No extra-axial collection. No mass effect or midline shift. Age-related atrophy and mild periventricular white matter disease. ORBITS: The patient is status post bilateral cataract surgery. SINUSES: No acute abnormality. SOFT TISSUES AND SKULL: No acute soft tissue abnormality. No skull fracture. IMPRESSION: 1. No acute intracranial abnormality. 2. Age-related atrophy and mild periventricular white matter disease. Electronically signed by: Evalene Coho MD 09/16/2023 10:44 AM EDT RP Workstation: [id number]     Labs:   Basic Metabolic Panel: Recent Labs  Lab 09/16/23 1230 09/17/23 0546 09/18/23 0543 09/19/23 0723  NA 133* 139 134* 132*  K 5.5* 3.7 3.3* 4.0  CL 100 112* 109 105  CO2 21* 20* 19* 15*  GLUCOSE 162* 108* 97 143*  BUN 10 31* 26* 35*  CREATININE 1.37* 1.00 0.91 1.58*  CALCIUM 9.3 8.1* 8.0* 8.8*  MG  --  2.0 1.9 2.0  PHOS  --  2.8  --   --    GFR Estimated Creatinine Clearance: 25.1 mL/min (A) (by C-G formula based on SCr of 1.58 mg/dL (H)). Liver Function Tests: Recent Labs  Lab 09/16/23 1230  AST 24  ALT 16  ALKPHOS 104  BILITOT 0.9  PROT 7.2  ALBUMIN 3.7   No results for input(s): LIPASE, AMYLASE in the last 168 hours. No results for input(s): AMMONIA in  the last 168 hours. Coagulation profile No results for input(s): INR, PROTIME in the last 168 hours.  CBC: Recent Labs  Lab 09/16/23 1230 09/18/23 0543 09/19/23 0723  WBC 15.0* 7.8 12.8*  NEUTROABS 13.3*  --   --   HGB 13.9 10.9* 12.1  HCT 44.6 35.2* 37.9  MCV 88.8 91.4 88.6  PLT 380 284 343   Cardiac Enzymes: No results for input(s): CKTOTAL, CKMB, CKMBINDEX, TROPONINI in the last 168 hours. BNP: Invalid input(s): POCBNP CBG: No results for input(s): GLUCAP in the last 168 hours. D-Dimer No results for input(s): DDIMER in the last 72 hours. Hgb A1c No results for input(s): HGBA1C in the last 72 hours. Lipid Profile No results for  input(s): CHOL, HDL, LDLCALC, TRIG, CHOLHDL, LDLDIRECT in the last 72 hours. Thyroid  function studies No results for input(s): TSH, T4TOTAL, T3FREE, THYROIDAB in the last 72 hours.  Invalid input(s): FREET3 Anemia work up No results for input(s): VITAMINB12, FOLATE, FERRITIN, TIBC, IRON, RETICCTPCT in the last 72 hours. Microbiology Recent Results (from the past 240 hours)  Urine Culture     Status: Abnormal   Collection Time: 09/16/23 10:10 AM   Specimen: Urine, Random  Result Value Ref Range Status   Specimen Description   Final    URINE, RANDOM Performed at [facility], [address], [place], [place] [address]    Special Requests   Final    NONE Reflexed from [id number] Performed at [facility], [address] [place], [place] [address]    Culture >=100,000 COLONIES/mL ESCHERICHIA COLI (A)  Final   Report Status 09/19/2023 FINAL  Final   Organism ID, Bacteria ESCHERICHIA COLI (A)  Final      Susceptibility   Escherichia coli - MIC*    AMPICILLIN >=32 RESISTANT Resistant     CEFAZOLIN (URINE) Value in next row Sensitive      8 SENSITIVEThis is a modified FDA-approved test that has been validated and its performance characteristics determined by the reporting laboratory.  This laboratory is certified under the Clinical Laboratory Improvement Amendments CLIA as qualified to perform high complexity clinical laboratory testing.    CEFEPIME Value in next row Sensitive      8 SENSITIVEThis is a modified FDA-approved test that has been validated and its performance characteristics determined by the reporting laboratory.  This laboratory is certified under the Clinical Laboratory Improvement Amendments CLIA as qualified to perform high complexity clinical laboratory testing.    ERTAPENEM Value in next row Sensitive      8 SENSITIVEThis is a modified FDA-approved test that has been validated and its performance  characteristics determined by the reporting laboratory.  This laboratory is certified under the Clinical Laboratory Improvement Amendments CLIA as qualified to perform high complexity clinical laboratory testing.    CEFTRIAXONE  Value in next row Sensitive      8 SENSITIVEThis is a modified FDA-approved test that has been validated and its performance characteristics determined by the reporting laboratory.  This laboratory is certified under the Clinical Laboratory Improvement Amendments CLIA as qualified to perform high complexity clinical laboratory testing.    CIPROFLOXACIN Value in next row Resistant      8 SENSITIVEThis is a modified FDA-approved test that has been validated and its performance characteristics determined by the reporting laboratory.  This laboratory is certified under the Clinical Laboratory Improvement Amendments CLIA as qualified to perform high complexity clinical laboratory testing.    GENTAMICIN Value in next row Sensitive  8 SENSITIVEThis is a modified FDA-approved test that has been validated and its performance characteristics determined by the reporting laboratory.  This laboratory is certified under the Clinical Laboratory Improvement Amendments CLIA as qualified to perform high complexity clinical laboratory testing.    NITROFURANTOIN Value in next row Sensitive      8 SENSITIVEThis is a modified FDA-approved test that has been validated and its performance characteristics determined by the reporting laboratory.  This laboratory is certified under the Clinical Laboratory Improvement Amendments CLIA as qualified to perform high complexity clinical laboratory testing.    TRIMETH/SULFA Value in next row Sensitive      8 SENSITIVEThis is a modified FDA-approved test that has been validated and its performance characteristics determined by the reporting laboratory.  This laboratory is certified under the Clinical Laboratory Improvement Amendments CLIA as qualified to perform  high complexity clinical laboratory testing.    AMPICILLIN/SULBACTAM Value in next row Resistant      8 SENSITIVEThis is a modified FDA-approved test that has been validated and its performance characteristics determined by the reporting laboratory.  This laboratory is certified under the Clinical Laboratory Improvement Amendments CLIA as qualified to perform high complexity clinical laboratory testing.    PIP/TAZO Value in next row Sensitive ug/mL     <=4 SENSITIVEThis is a modified FDA-approved test that has been validated and its performance characteristics determined by the reporting laboratory.  This laboratory is certified under the Clinical Laboratory Improvement Amendments CLIA as qualified to perform high complexity clinical laboratory testing.    MEROPENEM Value in next row Sensitive      <=4 SENSITIVEThis is a modified FDA-approved test that has been validated and its performance characteristics determined by the reporting laboratory.  This laboratory is certified under the Clinical Laboratory Improvement Amendments CLIA as qualified to perform high complexity clinical laboratory testing.    * >=100,000 COLONIES/mL ESCHERICHIA COLI  Culture, blood (routine x 2)     Status: None (Preliminary result)   Collection Time: 09/16/23 12:10 PM   Specimen: BLOOD  Result Value Ref Range Status   Specimen Description   Final    BLOOD BLOOD LEFT FOREARM Performed at [facility], [address], [place], [place] [address]    Special Requests   Final    BOTTLES DRAWN AEROBIC AND ANAEROBIC Blood Culture results may not be optimal due to an inadequate volume of blood received in culture bottles Performed at [facility], [address] [place], [place] [address]    Culture   Final    NO GROWTH 3 DAYS Performed at [facility], [address], [place], [place] [address]    Report Status PENDING  Incomplete  Culture, blood (routine x 2)     Status: None  (Preliminary result)   Collection Time: 09/16/23 12:15 PM   Specimen: BLOOD  Result Value Ref Range Status   Specimen Description   Final    BLOOD LEFT ANTECUBITAL Performed at [facility], [address] [place], [place] [address]    Special Requests   Final    BOTTLES DRAWN AEROBIC AND ANAEROBIC Blood Culture results may not be optimal due to an inadequate volume of blood received in culture bottles Performed at [facility], [address] [place], [place] [address]    Culture   Final    NO GROWTH 3 DAYS Performed at [facility], [address] [place], [place] [address]  Report Status PENDING  Incomplete     Discharge Instructions:   Discharge Instructions     Call MD for:  persistant nausea and vomiting   Complete by: As directed    Call MD for:  temperature >100.4   Complete by: As directed    Diet - low sodium heart healthy   Complete by: As directed    Discharge instructions   Complete by: As directed    Follow-up with your primary care provider in 1 week.  Complete course of antibiotic.  Seek medical attention for worsening symptoms.  Continue In-N-Out catheterization twice a week.  Follow-up with your urologist as outpatient.   Increase activity slowly   Complete by: As directed       Allergies as of 09/19/2023       Reactions   Amoxicillin Nausea Only, Other (See Comments)   In 2017, developed some nausea but took in 2024 and did fine   Amoxicillin-pot Clavulanate Nausea Only   Cefuroxime Nausea Only   Influenza Vac Split Quad Other (See Comments)   Fatigue, muscle aches, and dizziness   Influenza Virus Vaccine Other (See Comments)   Fatigue, muscle aches, and dizziness   Levofloxacin  Nausea Only, Other (See Comments)   Tolerated OK in 2017(??)   Paroxetine Hcl Other (See Comments)   Allergic, per [facility]   Pneumococcal 13-val Conj Vacc Other (See Comments)   Fatigue, muscle aches   Quinolones Other (See  Comments)   Reaction not known   Sulfa Antibiotics Hives, Nausea And Vomiting   Topiramate Other (See Comments)   Dizziness, headaches, and foggy-headed   Moxifloxacin Rash, Other (See Comments)   Stomach upset, too        Medication List     STOP taking these medications    Geri-Tussin DM 10-100 MG/5ML liquid Generic drug: dextromethorphan-guaiFENesin   nitrofurantoin 50 MG capsule Commonly known as: MACRODANTIN       TAKE these medications    cefadroxil  500 MG capsule Commonly known as: DURICEF Take 2 capsules (1,000 mg total) by mouth 2 (two) times daily for 4 days.   citalopram  10 MG tablet Commonly known as: CELEXA  Take 10 mg by mouth daily.   clonazePAM  0.5 MG tablet Commonly known as: KLONOPIN  Take 0.5 mg by mouth at bedtime.   metoprolol  succinate 50 MG 24 hr tablet Commonly known as: TOPROL -XL Take 0.5 tablets (25 mg total) by mouth daily.   nystatin  powder Apply 1 Application topically 2 (two) times daily as needed (for red rashes- under the breasts).   ondansetron  4 MG tablet Commonly known as: ZOFRAN  Take 1 tablet (4 mg total) by mouth every 6 (six) hours as needed for nausea or vomiting.   Pepto Bismol 262 MG Caps Generic drug: Bismuth  Subsalicylate Take 262 mg by mouth 4 (four) times daily as needed (for constipation, heartburn indigestion, upset stomach, and/or diarrhea).   traZODone  100 MG tablet Commonly known as: DESYREL  Take 100 mg by mouth at bedtime.   triamcinolone  cream 0.1 % Commonly known as: KENALOG  Apply 1 Application topically 2 (two) times daily as needed (for itching- PERIANAL AREA).   TYLENOL  500 MG tablet Generic drug: acetaminophen  Take 500 mg by mouth every 4 (four) hours as needed for mild pain (pain score 1-3) (or headaches, or a fever of 100 degrees F or greater).        Follow-up Information     Pahwani, Velna SAUNDERS, MD Follow up.   Specialty: Internal Medicine Contact information: [address] [place] [address] [phone]                  Time coordinating discharge: 39 minutes  Signed:  Daena Alper  [facility] 09/19/2023, 2:42 PM

## 2023-09-20 ENCOUNTER — Encounter (HOSPITAL_BASED_OUTPATIENT_CLINIC_OR_DEPARTMENT_OTHER): Payer: Self-pay

## 2023-09-20 ENCOUNTER — Other Ambulatory Visit: Payer: Self-pay

## 2023-09-20 ENCOUNTER — Emergency Department (HOSPITAL_BASED_OUTPATIENT_CLINIC_OR_DEPARTMENT_OTHER)
Admission: EM | Admit: 2023-09-20 | Discharge: 2023-09-20 | Disposition: A | Discharge status: Home / Self Care | Attending: Emergency Medicine | Admitting: Emergency Medicine

## 2023-09-20 DIAGNOSIS — F0390 Unspecified dementia without behavioral disturbance: Secondary | ICD-10-CM | POA: Insufficient documentation

## 2023-09-20 DIAGNOSIS — S61214A Laceration without foreign body of right ring finger without damage to nail, initial encounter: Secondary | ICD-10-CM | POA: Insufficient documentation

## 2023-09-20 DIAGNOSIS — Z79899 Other long term (current) drug therapy: Secondary | ICD-10-CM | POA: Insufficient documentation

## 2023-09-20 DIAGNOSIS — S6991XA Unspecified injury of right wrist, hand and finger(s), initial encounter: Secondary | ICD-10-CM | POA: Diagnosis present

## 2023-09-20 DIAGNOSIS — W19XXXA Unspecified fall, initial encounter: Secondary | ICD-10-CM | POA: Diagnosis not present

## 2023-09-20 DIAGNOSIS — N390 Urinary tract infection, site not specified: Secondary | ICD-10-CM | POA: Diagnosis not present

## 2023-09-20 MED ORDER — LIDOCAINE-EPINEPHRINE (PF) 2 %-1:200000 IJ SOLN
10.0000 mL | Freq: Once | INTRAMUSCULAR | Status: AC
  Administered 2023-09-20: 10 mL via INTRADERMAL
  Filled 2023-09-20: qty 20

## 2023-09-20 NOTE — ED Notes (Signed)
 EDP at the bedside.  ?

## 2023-09-20 NOTE — ED Triage Notes (Signed)
 Patient here POV from [facility] with family.  Had an unwitnessed fall at the facility this AM about 1-2 hours ago. Recently discharged from [facility] for UTI. Injury noted to Third Right Digit. No Known Head Injury and no anticoagulants.   NAD Noted during Triage. Dementia at baseline.

## 2023-09-20 NOTE — ED Provider Notes (Signed)
 [place] EMERGENCY DEPARTMENT AT [facility] Provider Note   CSN: [id number] Arrival date & time: 09/20/23  9273     Patient presents with: [place]   [name] is a 83 y.o. female.   83 yo F with a chief complaint of a fall.  Patient has been a bit weak after her recent admission for a urinary tract infection.  Has been slowly improving per family.  Unfortunately suffered a fall on anything for tried to grab onto something and suffered a laceration to the right ring finger.  Denies other injury.  Patient with severe dementia unable to communicate at baseline.   Fall       Prior to Admission medications   Medication Sig Start Date End Date Taking? Authorizing Provider  acetaminophen  (TYLENOL ) 500 MG tablet Take 500 mg by mouth every 4 (four) hours as needed for mild pain (pain score 1-3) (or headaches, or a fever of 100 degrees F or greater).    [provider]  cefadroxil  (DURICEF) 500 MG capsule Take 2 capsules (1,000 mg total) by mouth 2 (two) times daily for 4 days. 09/19/23 09/23/23  Pokhrel, Laxman, MD  citalopram  (CELEXA ) 10 MG tablet Take 10 mg by mouth daily. 10/07/20   [provider]  clonazePAM  (KLONOPIN ) 0.5 MG tablet Take 0.5 mg by mouth at bedtime.    [provider]  metoprolol  succinate (TOPROL -XL) 50 MG 24 hr tablet Take 0.5 tablets (25 mg total) by mouth daily. 01/19/23   Yolande Lamar BROCKS, MD  nystatin  powder Apply 1 Application topically 2 (two) times daily as needed (for red rashes- under the breasts).    [provider]  ondansetron  (ZOFRAN ) 4 MG tablet Take 1 tablet (4 mg total) by mouth every 6 (six) hours as needed for nausea or vomiting. 09/19/23   Pokhrel, Laxman, MD  PEPTO BISMOL 262 MG CAPS Take 262 mg by mouth 4 (four) times daily as needed (for constipation, heartburn indigestion, upset stomach, and/or diarrhea).    [provider]  traZODone  (DESYREL ) 100 MG tablet Take 100 mg by mouth at bedtime.     [provider]  triamcinolone  cream (KENALOG ) 0.1 % Apply 1 Application topically 2 (two) times daily as needed (for itching- PERIANAL AREA).    [provider]    Allergies: Amoxicillin, Amoxicillin-pot clavulanate, Cefuroxime, Influenza vac split quad, Influenza virus vaccine, Levofloxacin , Paroxetine hcl, Pneumococcal 13-val conj vacc, Quinolones, Sulfa antibiotics, Topiramate, and Moxifloxacin    Review of Systems  Updated Vital Signs BP (!) 129/48 (BP Location: Right Arm)   Pulse 92   Temp 97.6 F (36.4 C) (Temporal)   Resp 18   Ht 5' 5 (1.651 m)   Wt 60.6 kg   SpO2 97%   BMI 22.23 kg/m   Physical Exam Vitals and nursing note reviewed.  Constitutional:      General: She is not in acute distress.    Appearance: She is well-developed. She is not diaphoretic.  HENT:     Head: Normocephalic and atraumatic.  Eyes:     Pupils: Pupils are equal, round, and reactive to light.  Cardiovascular:     Rate and Rhythm: Normal rate and regular rhythm.     Heart sounds: No murmur heard.    No friction rub. No gallop.  Pulmonary:     Effort: Pulmonary effort is normal.     Breath sounds: No wheezing or rales.  Abdominal:     General: There is no distension.  Palpations: Abdomen is soft.     Tenderness: There is no abdominal tenderness.  Musculoskeletal:        General: No tenderness.     Cervical back: Normal range of motion and neck supple.     Comments: Laceration of the PIP of the right fourth digit on the palmar aspect.  Exposed tendon.  Full range of motion of the finger.  Palpated from head to toe without any obvious and areas of bony tenderness.  Skin:    General: Skin is warm and dry.  Neurological:     Mental Status: She is alert and oriented to person, place, and time.  Psychiatric:        Behavior: Behavior normal.     (all labs ordered are listed, but only abnormal results are displayed) Labs Reviewed - No data to  display  EKG: None  Radiology: No results found.   .Laceration Repair  Date/Time: 09/20/2023 8:17 AM  Performed by: Emil Share, DO Authorized by: Emil Share, DO   Consent:    Consent obtained:  Verbal   Consent given by:  Patient   Risks, benefits, and alternatives were discussed: yes     Risks discussed:  Infection, pain, poor cosmetic result and poor wound healing   Alternatives discussed:  No treatment Universal protocol:    Procedure explained and questions answered to patient or proxy's satisfaction: yes     Immediately prior to procedure, a time out was called: yes     Patient identity confirmed:  Verbally with patient Anesthesia:    Anesthesia method:  Nerve block   Block location:  Digital   Block needle gauge:  27 G   Block anesthetic:  Lidocaine  2% WITH epi   Block technique:  Digital   Block injection procedure:  Anatomic landmarks identified, anatomic landmarks palpated and negative aspiration for blood   Block outcome:  Anesthesia achieved Laceration details:    Location:  Finger   Finger location:  R ring finger   Length (cm):  2.8 Pre-procedure details:    Preparation:  Patient was prepped and draped in usual sterile fashion Exploration:    Hemostasis achieved with:  Epinephrine  and direct pressure Treatment:    Area cleansed with:  Chlorhexidine    Amount of cleaning:  Standard   Irrigation solution:  Sterile saline   Irrigation volume:  50   Irrigation method:  Pressure wash   Debridement:  None   Undermining:  None   Scar revision: no   Skin repair:    Repair method:  Sutures   Suture size:  5-0   Suture material:  Nylon   Suture technique:  Simple interrupted   Number of sutures:  4 Approximation:    Approximation:  Close Repair type:    Repair type:  Simple Post-procedure details:    Dressing:  Antibiotic ointment and adhesive bandage   Procedure completion:  Tolerated well, no immediate complications    Medications Ordered in the ED   lidocaine -EPINEPHrine  (XYLOCAINE  W/EPI) 2 %-1:200000 (PF) injection 10 mL (10 mLs Intradermal Given 09/20/23 0749)                                    Medical Decision Making Risk Prescription drug management.   83 yo F with a chief complaint of a fall.  Nonsyncopal per history.  Complaining of a laceration to the right hand.  Patient's records reviewed last tetanus  was in April 2024.  Wound was repaired at bedside.  Will discharge home.  PCP follow-up.  8:18 AM:  I have discussed the diagnosis/risks/treatment options with the patient and family.  Evaluation and diagnostic testing in the emergency department does not suggest an emergent condition requiring admission or immediate intervention beyond what has been performed at this time.  They will follow up with PCP. We also discussed returning to the ED immediately if new or worsening sx occur. We discussed the sx which are most concerning (e.g., sudden worsening pain, fever, inability to tolerate by mouth,redness, drainage) that necessitate immediate return. Medications administered to the patient during their visit and any new prescriptions provided to the patient are listed below.  Medications given during this visit Medications  lidocaine -EPINEPHrine  (XYLOCAINE  W/EPI) 2 %-1:200000 (PF) injection 10 mL (10 mLs Intradermal Given 09/20/23 0749)     The patient appears reasonably screen and/or stabilized for discharge and I doubt any other medical condition or other [facility] requiring further screening, evaluation, or treatment in the ED at this time prior to discharge.       Final diagnoses:  Laceration of right ring finger without foreign body without damage to nail, initial encounter    ED Discharge Orders     None          Emil Share, DO 09/20/23 9181

## 2023-09-20 NOTE — Discharge Instructions (Signed)
 Return for redness drainage or if you get a fever.  Suture placed are made out of nylon.  Typically they are removed between day 7 and 10.  This can be done here at urgent care or at your family doctor's office.  The area can get wet but not fully immersed underwater.  No scrubbing.  If you really want to clean it you can apply a half-and-half hydrogen peroxide solution with water on a Q-tip.  You can apply an ointment a couple times a day this could be as simple as Vaseline but could also be an antibiotic ointment if you wish.

## 2023-09-21 LAB — CULTURE, BLOOD (ROUTINE X 2)
Culture: NO GROWTH
Culture: NO GROWTH

## 2023-10-11 ENCOUNTER — Encounter: Payer: Self-pay | Admitting: Physician Assistant

## 2023-10-12 MED ORDER — CITALOPRAM HYDROBROMIDE 20 MG PO TABS: ORAL_TABLET | ORAL | 3 refills | Status: AC

## 2023-10-12 MED ORDER — CITALOPRAM HYDROBROMIDE 20 MG PO TABS: ORAL_TABLET | ORAL | 3 refills | Status: DC

## 2023-10-12 NOTE — Telephone Encounter (Signed)
 Pls fax with instructions to increase Citalopram  to 20mg : Take 1 tablet daily. Thanks

## 2023-10-27 ENCOUNTER — Encounter: Payer: Self-pay | Admitting: Podiatry

## 2023-10-27 ENCOUNTER — Ambulatory Visit: Admitting: Podiatry

## 2023-10-27 DIAGNOSIS — M79674 Pain in right toe(s): Secondary | ICD-10-CM

## 2023-10-27 DIAGNOSIS — G309 Alzheimer's disease, unspecified: Secondary | ICD-10-CM | POA: Diagnosis not present

## 2023-10-27 DIAGNOSIS — B351 Tinea unguium: Secondary | ICD-10-CM | POA: Diagnosis not present

## 2023-10-27 DIAGNOSIS — M79675 Pain in left toe(s): Secondary | ICD-10-CM | POA: Diagnosis not present

## 2023-10-27 DIAGNOSIS — F0280 Dementia in other diseases classified elsewhere without behavioral disturbance: Secondary | ICD-10-CM

## 2023-10-27 NOTE — Progress Notes (Signed)
 This patient presents to the office with chief complaint of long thick painful nails.  Patient nails are painful walking and wearing shoes.  This patient is unable to self treat.  This patient is unable to trim her nails since she is unable to reach her nails.  She presents to the office with her son.  She presents to the office for preventative foot care services.  General Appearance  Alert, conversant and in no acute stress.  Vascular  Dorsalis pedis and posterior tibial  pulses are palpable  bilaterally.  Capillary return is within normal limits  bilaterally. Temperature is within normal limits  bilaterally.  Neurologic  Senn-Weinstein monofilament wire test within normal limits  bilaterally. Muscle power within normal limits bilaterally.  Nails Thick disfigured discolored nails with subungual debris  from hallux to fifth toes bilaterally. No evidence of bacterial infection or drainage bilaterally.  Orthopedic  No limitations of motion  feet .  No crepitus or effusions noted.  No bony pathology or digital deformities noted. Hallux limitus 1st MPJ right foot.  Hammer toes  B/L.  Skin  normotropic skin with no porokeratosis noted bilaterally.  No signs of infections or ulcers noted.     Onychomycosis  Nails  B/L.  Pain in right toes  Pain in left toes  Debridement of nails both feet followed trimming the nails with dremel tool. Padding dispensed for callus 1/2 right foot.   RTC 3 months.   Cordella Bold DPM

## 2023-11-10 ENCOUNTER — Ambulatory Visit: Admitting: Podiatry

## 2024-01-20 ENCOUNTER — Ambulatory Visit: Admitting: Podiatry

## 2024-01-20 ENCOUNTER — Encounter: Payer: Self-pay | Admitting: Podiatry

## 2024-01-20 VITALS — Ht 65.0 in | Wt 133.6 lb

## 2024-01-20 DIAGNOSIS — M79675 Pain in left toe(s): Secondary | ICD-10-CM | POA: Diagnosis not present

## 2024-01-20 DIAGNOSIS — B351 Tinea unguium: Secondary | ICD-10-CM

## 2024-01-20 DIAGNOSIS — M2041 Other hammer toe(s) (acquired), right foot: Secondary | ICD-10-CM | POA: Diagnosis not present

## 2024-01-20 DIAGNOSIS — M79674 Pain in right toe(s): Secondary | ICD-10-CM | POA: Diagnosis not present

## 2024-01-20 MED ORDER — MUPIROCIN 2 % EX OINT: 1.0000 | TOPICAL_OINTMENT | Freq: Two times a day (BID) | CUTANEOUS | 1 refills | Status: AC

## 2024-01-20 NOTE — Progress Notes (Signed)
 "  Chief Complaint  Patient presents with   Nail Problem    Pt is here for RFC and callous removal.    SUBJECTIVE Patient presents to office today complaining of elongated, thickened nails that cause pain while ambulating in shoes.  Patient is unable to trim their own nails. Also has a symptomatic callus to the right second toe. Patient is here for further evaluation and treatment.  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 Derm Preulcerative callus noted to the distal tip of the right second toe secondary to hammertoe formation. Nails are tender, long, thickened and dystrophic with subungual debris, consistent with onychomycosis, 1-5 bilateral. No signs of infection noted. Vasc  DP and PT pedal pulses palpable bilaterally. Temperature gradient within normal limits.  Neuro Grossly intact via light touch Musculoskeletal Exam Hammertoe deformity noted to the right second toe w/ preulcerative callus to the distal tip of the toe  ASSESSMENT 1.  Pain due to onychomycosis of toenails both 2.  Hammertoe right second w/ preulcerative callus distal  tuft   PLAN OF CARE -Patient evaluated today.  -Instructed to maintain good pedal hygiene and foot care.  -Mechanical debridement of nails 1-5 bilaterally performed using a nail nipper. Filed with dremel without incident.  -excisional debridement of the callus to the distal tip of the toe was performed today using a no15 scalpel w/out incident -recommend shoes that do not irritate the distal tip of the toe. Advise against going barefoot -Rx Mupirocin  2% ointment to apply to the distal toe -Return to clinic in 3 mos.    Thresa EMERSON Sar, DPM [facility]  Dr. Thresa EMERSON Sar, DPM    [address], [place] [address]                Office [phone]  Fax [phone]    [1]  Allergies Allergen Reactions   Amoxicillin Nausea Only and Other (See Comments)    In 2017, developed some nausea but took in 2024 and did fine   Amoxicillin-Pot Clavulanate Nausea Only   Cefuroxime Nausea Only   Influenza Vac Split Quad Other (See Comments)    Fatigue, muscle aches, and dizziness   Influenza Virus Vaccine Other (See Comments)    Fatigue, muscle aches, and dizziness   Levofloxacin  Nausea Only and Other (See Comments)    Tolerated  OK in 2017(??)   Paroxetine Hcl Other (See Comments)    Allergic, per [facility]   Pneumococcal 13-Val Conj Vacc Other (See Comments)    Fatigue, muscle aches   Quinolones Other (See Comments)    Reaction not known   Sulfa Antibiotics Hives and Nausea And Vomiting   Topiramate Other (See Comments)    Dizziness, headaches, and foggy-headed   Moxifloxacin Rash and Other (See Comments)    Stomach upset, too   "

## 2024-01-25 ENCOUNTER — Ambulatory Visit: Admitting: Podiatry

## 2024-02-26 ENCOUNTER — Other Ambulatory Visit: Payer: Self-pay | Admitting: Internal Medicine

## 2024-02-26 ENCOUNTER — Ambulatory Visit
Admission: RE | Admit: 2024-02-26 | Discharge: 2024-02-26 | Disposition: A | Source: Ambulatory Visit | Attending: Internal Medicine

## 2024-02-26 DIAGNOSIS — R059 Cough, unspecified: Secondary | ICD-10-CM

## 2024-04-20 ENCOUNTER — Ambulatory Visit: Admitting: Podiatry
